# Patient Record
Sex: Female | Born: 1935
Health system: Southern US, Community
[De-identification: ages and names within clinical notes are randomized; demographics above are authoritative.]

## PROBLEM LIST (undated history)

## (undated) DIAGNOSIS — H409 Unspecified glaucoma: Secondary | ICD-10-CM

## (undated) DIAGNOSIS — C439 Malignant melanoma of skin, unspecified: Secondary | ICD-10-CM

## (undated) DIAGNOSIS — G4700 Insomnia, unspecified: Secondary | ICD-10-CM

## (undated) DIAGNOSIS — K5730 Diverticulosis of large intestine without perforation or abscess without bleeding: Secondary | ICD-10-CM

## (undated) DIAGNOSIS — K2970 Gastritis, unspecified, without bleeding: Secondary | ICD-10-CM

## (undated) DIAGNOSIS — G629 Polyneuropathy, unspecified: Secondary | ICD-10-CM

## (undated) DIAGNOSIS — M1990 Unspecified osteoarthritis, unspecified site: Secondary | ICD-10-CM

## (undated) DIAGNOSIS — I10 Essential (primary) hypertension: Secondary | ICD-10-CM

## (undated) DIAGNOSIS — C801 Malignant (primary) neoplasm, unspecified: Secondary | ICD-10-CM

## (undated) DIAGNOSIS — R32 Unspecified urinary incontinence: Secondary | ICD-10-CM

## (undated) DIAGNOSIS — K219 Gastro-esophageal reflux disease without esophagitis: Secondary | ICD-10-CM

## (undated) DIAGNOSIS — Z973 Presence of spectacles and contact lenses: Secondary | ICD-10-CM

## (undated) DIAGNOSIS — G2581 Restless legs syndrome: Secondary | ICD-10-CM

## (undated) HISTORY — DX: Diverticulosis of large intestine without perforation or abscess without bleeding: K57.30

## (undated) HISTORY — DX: Malignant melanoma of skin, unspecified: C43.9

## (undated) HISTORY — DX: Gastritis, unspecified, without bleeding: K29.70

## (undated) HISTORY — DX: Essential (primary) hypertension: I10

## (undated) HISTORY — PX: ABDOMINAL HYSTERECTOMY: SHX81

## (undated) HISTORY — DX: Malignant (primary) neoplasm, unspecified: C80.1

## (undated) HISTORY — PX: COLONOSCOPY: SHX174

## (undated) HISTORY — PX: ESOPHAGOGASTRODUODENOSCOPY: SHX1529

## (undated) HISTORY — DX: Restless legs syndrome: G25.81

---

## 1994-03-16 DIAGNOSIS — C439 Malignant melanoma of skin, unspecified: Secondary | ICD-10-CM

## 1994-03-16 HISTORY — DX: Malignant melanoma of skin, unspecified: C43.9

## 1997-05-02 ENCOUNTER — Ambulatory Visit (HOSPITAL_COMMUNITY): Admission: RE | Admit: 1997-05-02 | Discharge: 1997-05-02 | Payer: Self-pay | Admitting: Obstetrics & Gynecology

## 1999-01-27 ENCOUNTER — Ambulatory Visit (HOSPITAL_COMMUNITY): Admission: RE | Admit: 1999-01-27 | Discharge: 1999-01-27 | Payer: Self-pay | Admitting: Obstetrics & Gynecology

## 1999-01-27 ENCOUNTER — Encounter: Payer: Self-pay | Admitting: Obstetrics & Gynecology

## 1999-06-02 ENCOUNTER — Other Ambulatory Visit: Admission: RE | Admit: 1999-06-02 | Discharge: 1999-06-02 | Payer: Self-pay | Admitting: Obstetrics and Gynecology

## 2000-02-25 ENCOUNTER — Encounter: Payer: Self-pay | Admitting: Obstetrics and Gynecology

## 2000-02-25 ENCOUNTER — Ambulatory Visit (HOSPITAL_COMMUNITY): Admission: RE | Admit: 2000-02-25 | Discharge: 2000-02-25 | Payer: Self-pay | Admitting: Obstetrics and Gynecology

## 2000-08-20 ENCOUNTER — Encounter: Admission: RE | Admit: 2000-08-20 | Discharge: 2000-08-20 | Payer: Self-pay

## 2000-08-27 ENCOUNTER — Encounter: Admission: RE | Admit: 2000-08-27 | Discharge: 2000-08-27 | Payer: Self-pay

## 2001-03-16 DIAGNOSIS — K5730 Diverticulosis of large intestine without perforation or abscess without bleeding: Secondary | ICD-10-CM

## 2001-03-16 HISTORY — DX: Diverticulosis of large intestine without perforation or abscess without bleeding: K57.30

## 2001-04-13 ENCOUNTER — Encounter: Payer: Self-pay | Admitting: Obstetrics and Gynecology

## 2001-04-13 ENCOUNTER — Ambulatory Visit (HOSPITAL_COMMUNITY): Admission: RE | Admit: 2001-04-13 | Discharge: 2001-04-13 | Payer: Self-pay | Admitting: Obstetrics and Gynecology

## 2001-10-10 ENCOUNTER — Ambulatory Visit (HOSPITAL_COMMUNITY): Admission: RE | Admit: 2001-10-10 | Discharge: 2001-10-10 | Payer: Self-pay | Admitting: Gastroenterology

## 2002-04-03 ENCOUNTER — Encounter: Payer: Self-pay | Admitting: *Deleted

## 2002-04-03 ENCOUNTER — Ambulatory Visit (HOSPITAL_COMMUNITY): Admission: RE | Admit: 2002-04-03 | Discharge: 2002-04-03 | Payer: Self-pay | Admitting: *Deleted

## 2003-02-07 ENCOUNTER — Encounter: Admission: RE | Admit: 2003-02-07 | Discharge: 2003-02-07 | Payer: Self-pay

## 2003-08-01 ENCOUNTER — Ambulatory Visit (HOSPITAL_COMMUNITY): Admission: RE | Admit: 2003-08-01 | Discharge: 2003-08-01 | Payer: Self-pay

## 2003-12-24 ENCOUNTER — Encounter: Admission: RE | Admit: 2003-12-24 | Discharge: 2003-12-24 | Payer: Self-pay | Admitting: Family Medicine

## 2004-09-18 ENCOUNTER — Ambulatory Visit (HOSPITAL_COMMUNITY): Admission: RE | Admit: 2004-09-18 | Discharge: 2004-09-18 | Payer: Self-pay | Admitting: *Deleted

## 2005-10-12 ENCOUNTER — Ambulatory Visit (HOSPITAL_COMMUNITY): Admission: RE | Admit: 2005-10-12 | Discharge: 2005-10-12 | Payer: Self-pay | Admitting: Family Medicine

## 2005-10-20 ENCOUNTER — Encounter: Admission: RE | Admit: 2005-10-20 | Discharge: 2005-10-20 | Payer: Self-pay | Admitting: Family Medicine

## 2006-03-16 DIAGNOSIS — K2970 Gastritis, unspecified, without bleeding: Secondary | ICD-10-CM

## 2006-03-16 HISTORY — DX: Gastritis, unspecified, without bleeding: K29.70

## 2006-07-25 ENCOUNTER — Emergency Department (HOSPITAL_COMMUNITY): Admission: EM | Admit: 2006-07-25 | Discharge: 2006-07-25 | Payer: Self-pay | Admitting: Emergency Medicine

## 2006-11-22 ENCOUNTER — Encounter: Admission: RE | Admit: 2006-11-22 | Discharge: 2006-11-22 | Payer: Self-pay | Admitting: Family Medicine

## 2007-05-09 ENCOUNTER — Encounter: Admission: RE | Admit: 2007-05-09 | Discharge: 2007-05-09 | Payer: Self-pay | Admitting: Family Medicine

## 2007-05-12 ENCOUNTER — Encounter: Admission: RE | Admit: 2007-05-12 | Discharge: 2007-05-12 | Payer: Self-pay | Admitting: Family Medicine

## 2007-11-22 ENCOUNTER — Ambulatory Visit (HOSPITAL_COMMUNITY): Admission: RE | Admit: 2007-11-22 | Discharge: 2007-11-22 | Payer: Self-pay | Admitting: Family Medicine

## 2009-06-04 ENCOUNTER — Ambulatory Visit (HOSPITAL_COMMUNITY): Admission: RE | Admit: 2009-06-04 | Discharge: 2009-06-04 | Payer: Self-pay | Admitting: Family Medicine

## 2010-06-25 ENCOUNTER — Other Ambulatory Visit (HOSPITAL_COMMUNITY): Payer: Self-pay | Admitting: Physician Assistant

## 2010-06-25 DIAGNOSIS — Z1231 Encounter for screening mammogram for malignant neoplasm of breast: Secondary | ICD-10-CM

## 2010-07-14 ENCOUNTER — Ambulatory Visit (HOSPITAL_COMMUNITY)
Admission: RE | Admit: 2010-07-14 | Discharge: 2010-07-14 | Disposition: A | Discharge status: Home / Self Care | Payer: Medicare Other | Source: Ambulatory Visit | Attending: Physician Assistant | Admitting: Physician Assistant

## 2010-07-14 DIAGNOSIS — Z1231 Encounter for screening mammogram for malignant neoplasm of breast: Secondary | ICD-10-CM | POA: Insufficient documentation

## 2010-08-01 NOTE — Op Note (Signed)
   Crystal Vazquez, Crystal Vazquez                      ACCOUNT NO.:  [id number]   MEDICAL RECORD NO.:  [id number]                   PATIENT TYPE:  AMB   LOCATION:  ENDO                                 FACILITY:  [facility]   PHYSICIAN:  James L. Malon Kindle., M.D.          DATE OF BIRTH:  [birth year]   DATE OF PROCEDURE:  10/10/2001  DATE OF DISCHARGE:                                 OPERATIVE REPORT   PROCEDURE:  Colonoscopy.   MEDICATIONS:  Fentanyl 87.5 mcg, Versed 8 mg IV.   INDICATIONS FOR PROCEDURE:  Lower abdominal pain and rectal bleeding.   SCOPE:  Olympus pediatric videocolonoscope.   DESCRIPTION OF PROCEDURE:  The procedure had been explained and the  patient's consent obtained.  With the patient in the left lateral decubitus  position the Olympus pediatric videocolonoscope was inserted and advanced  under direct visualization.  The prep was quite good.  The patient had a  somewhat long, tortuous colon with minimal diverticulosis.  Once we had  passed the sigmoid colon area we advanced fairly easily to the cecum;  ileocecal valve and appendiceal orifice seen.  The scope was withdrawn and  the colon carefully examined.  The cecum, ascending colon, hepatic flexure,  transverse colon, splenic flexure, descending, and sigmoid colon seen well  upon removal.  No polyps seen.  A few scattered tics but no evidence of  diverticulitis.  The scope was withdrawn.  The patient tolerated the  procedure well.  Fairly significant enteral hemorrhoids seen in the rectum  upon removal of the scope.  The patient was maintained on low-flow oxygen  with pulse oximeter assessment.   ASSESSMENT:  Rectal bleeding, no evidence of polyps; probably due to  moderate size internal hemorrhoids.   PLAN:  Will treat for hemorrhoids and Metamucil fiber supplements, and a  hemorrhoid instruction sheet.  Will see back in the office in six weeks.                                               James L. Malon Kindle.,  M.D.    Waldron Session  D:  10/10/2001  T:  10/14/2001  Job:  [id number]   cc:   Kizzie Furnish, M.D.

## 2011-01-11 ENCOUNTER — Encounter: Payer: Self-pay | Admitting: Ophthalmology

## 2011-01-11 ENCOUNTER — Emergency Department (HOSPITAL_COMMUNITY): Payer: Medicare Other

## 2011-01-11 ENCOUNTER — Inpatient Hospital Stay (HOSPITAL_COMMUNITY)
Admission: EM | Admit: 2011-01-11 | Discharge: 2011-01-15 | DRG: 392 | Disposition: A | Discharge status: Home / Self Care | Payer: Medicare Other | Attending: Internal Medicine | Admitting: Internal Medicine

## 2011-01-11 DIAGNOSIS — Z79899 Other long term (current) drug therapy: Secondary | ICD-10-CM

## 2011-01-11 DIAGNOSIS — Z882 Allergy status to sulfonamides status: Secondary | ICD-10-CM

## 2011-01-11 DIAGNOSIS — A09 Infectious gastroenteritis and colitis, unspecified: Principal | ICD-10-CM | POA: Diagnosis present

## 2011-01-11 DIAGNOSIS — K644 Residual hemorrhoidal skin tags: Secondary | ICD-10-CM | POA: Diagnosis present

## 2011-01-11 DIAGNOSIS — Z7982 Long term (current) use of aspirin: Secondary | ICD-10-CM

## 2011-01-11 DIAGNOSIS — D126 Benign neoplasm of colon, unspecified: Secondary | ICD-10-CM | POA: Diagnosis present

## 2011-01-11 DIAGNOSIS — K5730 Diverticulosis of large intestine without perforation or abscess without bleeding: Secondary | ICD-10-CM | POA: Diagnosis present

## 2011-01-11 DIAGNOSIS — E7800 Pure hypercholesterolemia, unspecified: Secondary | ICD-10-CM | POA: Diagnosis present

## 2011-01-11 DIAGNOSIS — Z880 Allergy status to penicillin: Secondary | ICD-10-CM

## 2011-01-11 DIAGNOSIS — I10 Essential (primary) hypertension: Secondary | ICD-10-CM | POA: Diagnosis present

## 2011-01-11 LAB — TYPE AND SCREEN
ABO/RH(D): O POS
Antibody Screen: NEGATIVE

## 2011-01-11 LAB — BASIC METABOLIC PANEL
BUN: 16 mg/dL (ref 6–23)
CO2: 26 mEq/L (ref 19–32)
GFR calc non Af Amer: 54 mL/min — ABNORMAL LOW (ref >90)
Glucose, Bld: 109 mg/dL — ABNORMAL HIGH (ref 70–99)
Potassium: 3.9 mEq/L (ref 3.5–5.1)

## 2011-01-11 LAB — DIFFERENTIAL
Eosinophils Relative: 0 % (ref 0–5)
Lymphocytes Relative: 8 % — ABNORMAL LOW (ref 12–46)
Lymphs Abs: 1 10*3/uL (ref 0.7–4.0)
Monocytes Absolute: 1.1 10*3/uL — ABNORMAL HIGH (ref 0.1–1.0)
Neutro Abs: 10.7 10*3/uL — ABNORMAL HIGH (ref 1.7–7.7)

## 2011-01-11 LAB — CBC
HCT: 40.8 % (ref 36.0–46.0)
HCT: 41.2 % (ref 36.0–46.0)
Hemoglobin: 14.4 g/dL (ref 12.0–15.0)
Hemoglobin: 14.5 g/dL (ref 12.0–15.0)
MCHC: 35.3 g/dL (ref 30.0–36.0)
MCV: 93.2 fL (ref 78.0–100.0)
MCV: 93.8 fL (ref 78.0–100.0)
Platelets: 202 10*3/uL (ref 150–400)
RBC: 4.39 MIL/uL (ref 3.87–5.11)
RDW: 12.2 % (ref 11.5–15.5)
RDW: 12.4 % (ref 11.5–15.5)
WBC: 12.9 10*3/uL — ABNORMAL HIGH (ref 4.0–10.5)
WBC: 13.6 10*3/uL — ABNORMAL HIGH (ref 4.0–10.5)
WBC: 13.9 10*3/uL — ABNORMAL HIGH (ref 4.0–10.5)

## 2011-01-11 LAB — PROTIME-INR: INR: 0.95 (ref 0.00–1.49)

## 2011-01-11 LAB — ABO/RH: ABO/RH(D): O POS

## 2011-01-11 NOTE — H&P (Signed)
Hospital Admission Note Date: 01/11/2011  Patient name:  Crystal Vazquez   Medical record number:  [id number] Date of birth:  08/10/1926   Age: 77 y.o. Gender:  female PCP:    No primary provider on file.  Medical Service:   Internal Medicine Teaching Service   Attending physician:  Dr. Cliffton Asters First Contact:   Dr. Margorie John  Pager: [phone] Second Contact:   Dr. Johnette Abraham Pager: [phone] After Hours:    First Contact    Pager: [phone]      Second Contact   Pager: [phone]   Chief Complaint: abdominal cramping and bloody diarrhea  History of Present Illness: Patient is a 77 y.o. female with a PMHx of diverticulosis and gastritis, who presents with abdominal cramping and multiple episodes of bloody diarrhea that began last night after dinner. The pt was in her usual state of health until yesterday evening. She went out for dinner with her husband, son, and daughter-in-law; she ate a salad and crab cakes. On the way home from dinner around 8pm, pt began experiencing abdominal cramping, rated at 10/10, and began sweating. She stopped at a convenience store on the way home to use the bathroom, and had a bowel movement, which she described as brown and loose with bright red blood mixed in. She normally has harder stools, and generally has to strain to defecate. Pt reports waking up several times overnight to have more loose bloody stools. At one point, she stood up to use the bathroom and became lightheaded and consequently fell. She reports no injuries from this fall. When she woke up in the morning, she noticed that her nightgown and robe were stained with blood that had leaked from her rectum overnight. The abdominal pain has since improved; the cramping is intermittent with ongoing 2/10 pain. Pt reports no nausea or vomiting, no fevers, no chest pain or shortness of breath. Of note, her husband ate half of a crab cake at dinner and experienced abdominal cramping and nonbloody  diarrhea. No one else at dinner experienced these symptoms. She had an endoscopy in 2008 for gastritis and a colonoscopy in 2003.  Current Outpatient Medications: Current Outpatient Prescriptions  Medication Sig Dispense Refill  . aspirin 81 MG tablet Take 81 mg by mouth daily.        Marland Kitchen gabapentin (NEURONTIN) 300 MG capsule Take 300 mg by mouth 2 (two) times daily.        Marland Kitchen lisinopril (PRINIVIL,ZESTRIL) 20 MG tablet Take 20 mg by mouth daily.        Marland Kitchen zolpidem (AMBIEN) 10 MG tablet Take 10 mg by mouth at bedtime.         Allergies: Allergies  Allergen Reactions  . Penicillins Hives  . Sulfa Drugs Cross Reactors Hives    Past Medical History: Past Medical History  Diagnosis Date  . Diverticulosis of colon (without mention of hemorrhage) 2003    colonoscopy 2003  . Restless legs syndrome (RLS)   . Hypertension   . Gastritis 2008    endoscopy 2008  . Melanoma 1996    on back, excised with no recurrence   Past Surgical History: Past Surgical History  Procedure Date  . Abdominal hysterectomy    Family History: Family History  Problem Relation Age of Onset  . Alzheimer's disease Sister   . Breast cancer      sisters, aunt  . Aneurysm Mother     AAA  . Hypertension      multiple family  members  . Diabetes type II Brother   . Heart disease Mother     mother, aunts, uncles had"heart problems" unknown to pt   Social History: History   Social History  . Marital Status: Married    Spouse Name: N/A    Number of Children: N/A  . Years of Education: N/A   Occupational History  . Not on file.   Social History Main Topics  . Smoking status: Never Smoker   . Smokeless tobacco: Never Used  . Alcohol Use: No  . Drug Use: No  . Sexually Active: Not on file   Social History Narrative   Lives with husband. Has been retired for 16 years. Previously worked in Education officer, community and in a Museum/gallery curator. Has one son who lives near [place] with his wife and son.   Review  of Systems: Constitutional:  Denies fever, chills, diaphoresis, appetite change and fatigue.  Respiratory: Denies SOB, DOE, cough, chest tightness, and wheezing.  Cardiovascular: Denies chest pain, palpitations and leg swelling.  Gastrointestinal: Reports abdominal pain, diarrhea, blood in stool. Usually experiences constipation. Denies nausea, vomiting, and abdominal distention.  Genitourinary: Denies dysuria, urgency, frequency, hematuria, flank pain and difficulty urinating.  Skin: Denies pallor, rash and wound.  Neurological: Reports lightheadedness on standing. Denies dizziness, seizures, syncope, weakness, numbness and headaches.   Hematological: Denies adenopathy, easy bruising, personal or family bleeding history.  Psychiatric/ Behavioral: Denies suicidal ideation, mood changes, confusion, nervousness, sleep disturbance and agitation.   Vital Signs: T: 98.1 P: 80 BP: 179/80 RR: 22 O2 sat: 99% RA   Physical Exam: General: Vital signs reviewed and noted. Well-developed, well-nourished, in no acute distress; alert, appropriate and cooperative throughout examination.  Head: Normocephalic, atraumatic.  Eyes: PERRL, EOMI, No signs of anemia or jaundince.  Nose: Mucous membranes moist, not inflammed, nonerythematous.  Throat: Oropharynx nonerythematous, no exudate appreciated.   Neck: No deformities, masses, or tenderness noted.Supple, No carotid Bruits, no JVD.  Lungs:  Normal respiratory effort. Clear to auscultation BL without crackles or wheezes.  Heart: RRR. S1 and S2 normal without gallop, murmur, or rubs.  Abdomen:  BS hypoactive. Soft, Nondistended, diffusely tender to palpation.  Involuntary guarding and moderately tender to palpation in LLQ. No masses or organomegaly.  Extremities: 1+ pretibial edema.  Neurologic: A&O X3, CN II - XII are grossly intact. Motor strength is 5/5 in the all 4 extremities, Sensations intact to light touch, Cerebellar signs negative.  Skin: No visible  rashes. Slight but visible scar on right lower back from melanoma removal, midline incision scar from abdominal hysterectomy. Multiple seborrheic keratoses on back and abdomen, multiple cherry hemangiomas on abdomen.   Lab results: Basic Metabolic Panel: Recent Labs  [facility] 01/11/11 0951   NA 137   K 3.9   CL 101   CO2 26   GLUCOSE 109*   BUN 16   CREATININE 0.96   CALCIUM 10.0   MG --   PHOS --   CBC: Recent Labs  [facility] 01/11/11 0951   WBC 12.9*   NEUTROABS 10.7*   HGB 14.4   HCT 40.8   MCV 93.2   PLT 218   Imaging results:  ABDOMEN - 2 VIEW. IMPRESSION: No evidence of small bowel obstruction or free air.  Assessment & Plan: This is a pleasant 77 year old female with a history of diverticulosis and gastritis who presents with bright red blood per rectum and loose stools beginning after dinner last night. Because she has likely lost a good amount of  blood and fluid, we will admit her to the floor for fluid resuscitation, observation, and diagnosis and treatment of GI bleed.  1) Bloody diarrhea- Etiology is unclear at this point. The fact that this has presented with urgency and cramping, presents with a high WBC count, and shares similar characteristics to the symptoms experienced by her husband, who ate the same food, support an infectious cause. Lack of fever, chills, nausea, and vomiting make infection less likely, but do not rule it out. Possible infectious causes include EHEC, EIEC, Salmonella, Shigella, Campylobacter, and C. difficile. Her finding of diverticulosis on her last colonoscopy supports a diverticular bleed, but the diarrhea that accompanies it does not seem to support this diagnosis. Other differential diagnoses include ischemic colitis, colon cancer, and polyp. Because the pt has had bright red blood per rectum, rather than melena, a lower GI bleed is much more likely than an upper GI bleed, so bleeding ulcers and recurrent gastritis fall lower on the  differential.  -Abdominal CT -C. diff PCR, contact precautions -Stool culture for Salmonella, Shigella, Campylobacter -Protonix  2) Abdominal cramping- Etiology is unclear at this point, but suggests an infectious cause as mentioned above. -prn morphine  3) Lightheadedness and fall- The lightheadedness is likely due to the loss of blood and fluid. Pt's Hgb and fluid status will need to be monitored to ensure proper rehydration and to prevent anemia. -CBCs q6hr -NS @ 75cc/hr -Type and screen 2 units -Place two large bore IVs -Monitor Hgb and transfuse if <7 or symptomatic -Fall precautions  4) DVT PPX- SCDs    Butler Denmark, MSIII: _______________________________________  Date/ Time: _______________________________________    Margorie John, M.D. (PGY1): ____________________________________    Date/ Time: ____________________________________     Johnette Abraham, D.O. (PGY2): ____________________________________    Date/ Time: ____________________________________      I have seen and examined the patient. I reviewed the resident/fellow note and agree with the findings and plan of care as documented. My additions and revisions are included.   Signature:  ____________________________________________     Internal Medicine Teaching Service Attending    Date:    ____________________________________________

## 2011-01-12 LAB — CBC
HCT: 35 % — ABNORMAL LOW (ref 36.0–46.0)
Hemoglobin: 12.3 g/dL (ref 12.0–15.0)
MCH: 32.7 pg (ref 26.0–34.0)
MCH: 32.7 pg (ref 26.0–34.0)
MCHC: 34.5 g/dL (ref 30.0–36.0)
MCHC: 34.3 g/dL (ref 30.0–36.0)
MCHC: 34.4 g/dL (ref 30.0–36.0)
MCV: 94.8 fL (ref 78.0–100.0)
MCV: 94.6 fL (ref 78.0–100.0)
Platelets: 192 10*3/uL (ref 150–400)
Platelets: 196 10*3/uL (ref 150–400)
Platelets: 216 10*3/uL (ref 150–400)
RBC: 3.78 MIL/uL — ABNORMAL LOW (ref 3.87–5.11)
RBC: 4.04 MIL/uL (ref 3.87–5.11)
RDW: 12.5 % (ref 11.5–15.5)

## 2011-01-12 LAB — COMPREHENSIVE METABOLIC PANEL
ALT: 10 U/L (ref 0–35)
AST: 13 U/L (ref 0–37)
Albumin: 2.8 g/dL — ABNORMAL LOW (ref 3.5–5.2)
Chloride: 109 mEq/L (ref 96–112)
Creatinine, Ser: 0.95 mg/dL (ref 0.50–1.10)
Potassium: 3.6 mEq/L (ref 3.5–5.1)
Sodium: 141 mEq/L (ref 135–145)
Total Bilirubin: 0.8 mg/dL (ref 0.3–1.2)

## 2011-01-12 NOTE — Consult Note (Signed)
  NAMEOPHA, MCGHEE NO.:  [id number]  MEDICAL RECORD NO.:  [id number]  LOCATION:  6739                         FACILITY:  [facility]  PHYSICIAN:  Graylin Shiver, M.D.   DATE OF BIRTH:  October 19, 1926  DATE OF CONSULTATION:  01/11/2011 DATE OF DISCHARGE:                                CONSULTATION   REASON FOR CONSULTATION:  The patient is an 77 year old female, who went out to eat last night, and ate one and a half crab cakes along with a salad.  Shortly after eating that dinner on the way home, she had to stop because of abdominal cramping and use a bathroom.  She had diarrhea, then when she got home, she had more diarrhea and there was rectal bleeding associated with it.  She has continued to have some abdominal cramping as well as diarrhea with rectal bleeding and came to the emergency room today where she was subsequently admitted.  Of note is that her husband ate half of her crab cake and he also noticed some abdominal cramping and one episode of diarrhea, but his problem resolved.  She was seen in the ER and subsequently admitted for further evaluation.  She had a colonoscopy in 2003 which showed diverticulosis and some hemorrhoids.  There were no polyps.  ALLERGIES:  SULFA, PENICILLIN.  MEDICAL PROBLEMS: 1. Hypertension. 2. Hypercholesterolemia. 3. Prior surgeries. 4. Hysterectomy.  SOCIAL HISTORY:  Does not smoke, drink alcohol.  REVIEW OF SYSTEMS:  Systems review, no complaints of chest pain or shortness of breath.  PHYSICAL EXAMINATION:  GENERAL:  She does not appear in any acute distress.  She is nonicteric. HEART:  Regular rhythm.  No murmurs. LUNGS:  Clear. ABDOMEN:  Bowel sounds are present.  It is soft, nontender.  No hepatosplenomegaly.  IMPRESSION:  Diarrhea with rectal bleeding.  This may be an infectious diarrhea.  It is possible rectal bleeding could come from diverticulosis or from hemorrhoids.  Her hemoglobin and hematocrit are 14.4  and 40.8 respectively.  She does not appear in any specific distress at this time.  PLAN:  Check stool for enteric pathogens and also Clostridium difficile, although she does not recall being on any antibiotics lately.  Follow clinically.  Follow H and Hs.          ______________________________ Graylin Shiver, M.D.     SFG/MEDQ  D:  01/11/2011  T:  01/12/2011  Job:  [id number]  cc:   Cliffton Asters, M.D. James L. Malon Kindle., M.D.  Electronically Signed by Herbert Moors MD on 01/12/2011 09:18:14 AM

## 2011-01-13 DIAGNOSIS — R109 Unspecified abdominal pain: Secondary | ICD-10-CM

## 2011-01-13 DIAGNOSIS — K921 Melena: Secondary | ICD-10-CM

## 2011-01-13 LAB — CBC
HCT: 34.5 % — ABNORMAL LOW (ref 36.0–46.0)
Hemoglobin: 11.6 g/dL — ABNORMAL LOW (ref 12.0–15.0)
MCV: 96.4 fL (ref 78.0–100.0)
Platelets: 190 10*3/uL (ref 150–400)
Platelets: 196 10*3/uL (ref 150–400)
RBC: 3.52 MIL/uL — ABNORMAL LOW (ref 3.87–5.11)
RBC: 3.58 MIL/uL — ABNORMAL LOW (ref 3.87–5.11)
RDW: 12.8 % (ref 11.5–15.5)
WBC: 10.7 10*3/uL — ABNORMAL HIGH (ref 4.0–10.5)
WBC: 10 10*3/uL (ref 4.0–10.5)
WBC: 10.1 10*3/uL (ref 4.0–10.5)

## 2011-01-14 DIAGNOSIS — K921 Melena: Secondary | ICD-10-CM

## 2011-01-14 DIAGNOSIS — R109 Unspecified abdominal pain: Secondary | ICD-10-CM

## 2011-01-14 LAB — CBC
HCT: 31.1 % — ABNORMAL LOW (ref 36.0–46.0)
Hemoglobin: 10.5 g/dL — ABNORMAL LOW (ref 12.0–15.0)
MCH: 32.5 pg (ref 26.0–34.0)
MCH: 32.4 pg (ref 26.0–34.0)
MCHC: 33.8 g/dL (ref 30.0–36.0)
MCV: 96.7 fL (ref 78.0–100.0)
Platelets: 231 10*3/uL (ref 150–400)
RBC: 3.61 MIL/uL — ABNORMAL LOW (ref 3.87–5.11)

## 2011-01-15 ENCOUNTER — Other Ambulatory Visit: Payer: Self-pay | Admitting: Gastroenterology

## 2011-01-15 DIAGNOSIS — R109 Unspecified abdominal pain: Secondary | ICD-10-CM

## 2011-01-15 DIAGNOSIS — K921 Melena: Secondary | ICD-10-CM

## 2011-01-15 LAB — CBC
HCT: 31.5 % — ABNORMAL LOW (ref 36.0–46.0)
MCH: 32.4 pg (ref 26.0–34.0)
MCHC: 34 g/dL (ref 30.0–36.0)
RDW: 12.5 % (ref 11.5–15.5)

## 2011-01-16 ENCOUNTER — Emergency Department (HOSPITAL_COMMUNITY)
Admission: EM | Admit: 2011-01-16 | Discharge: 2011-01-17 | Disposition: A | Discharge status: Home / Self Care | Payer: Medicare Other | Attending: Emergency Medicine | Admitting: Emergency Medicine

## 2011-01-16 DIAGNOSIS — I10 Essential (primary) hypertension: Secondary | ICD-10-CM | POA: Insufficient documentation

## 2011-01-16 DIAGNOSIS — E7800 Pure hypercholesterolemia, unspecified: Secondary | ICD-10-CM | POA: Insufficient documentation

## 2011-01-16 DIAGNOSIS — Z79899 Other long term (current) drug therapy: Secondary | ICD-10-CM | POA: Insufficient documentation

## 2011-01-19 NOTE — Discharge Summary (Signed)
NAMEARMENIA, Crystal Vazquez NO.:  [id number]  MEDICAL RECORD NO.:  [id number]  LOCATION:  6739                         FACILITY:  [facility]  PHYSICIAN:  Lacretia Leigh. Hatcher, M.D.DATE OF BIRTH:  1926/06/08  DATE OF ADMISSION:  01/11/2011 DATE OF DISCHARGE:  01/15/2011                              DISCHARGE SUMMARY   DISCHARGE DIAGNOSES: 1. Gastrointestinal bleed.  The patient presented with bloody     diarrhea.  The cause of which was not completely ascertained.     Bleeding resolved by discharge. 2. Abdominal cramping and diarrhea, resolved on hospital day 2. 3. Acute blood loss anemia.  Hemoglobin dropped from 14.4 to 10.7 over     the course of hospitalization.  The patient experienced no symptoms     of anemia and was not transfused.  DISCHARGE MEDICATIONS: 1. Aspirin 81 mg tablet p.o. daily. 2. Gabapentin 300 mg capsule p.o. b.i.d. 3. Xalatan 1 drop in left eye every evening. 4. Lisinopril 5 mg tablet p.o. every morning. 5. Zolpidem 10 mg tablet p.o. every evening.  DISPOSITION AND FOLLOWUP:  The patient will follow up with her PCP, Dr. Pat Kocher on January 22, 2011, at 2:30 p.m.  Please recheck hemoglobin at that time and inquire about symptoms of anemia and resolution of GI bleeding and loose stools.  PROCEDURES PERFORMED: 1. Abdominal x-ray two view, January 11, 2011.  Impression:  No     evidence of small bowel obstruction or free air. 2. Colonoscopy January 15, 2011.  Findings:  External hemorrhoids,     otherwise normal digital rectal exam.  Quality was good.  Mild     colitis manifested by superficial ulceration, tissue     edema/friability, loss of vascularity was seen in confluent fashion     from distal transverse colon to the level of the proximal sigmoid     colon.  Biopsies obtained.  Few sigmoid colon diverticula.  4-mm     cecal polyp, removed with cold snare.  Otherwise normal colonoscopy     without apparent polyp, masses, vascular  ectasias, or inflammatory     changes.  No pseudomembranes.  No old or fresh blood seen to the     extent of our examination.  ENDOSCOPIC IMPRESSION: 1. Resolving colitis highly likely source of patient's diarrhea and     hematochezia, suspect infectious colitis.  Ischemic colitis is     another consideration.  Appearance is not typical of inflammatory     colitis due to Crohn's disease.  Biopsies obtained. 2. Sigmoid diverticulosis. 3. Diminutive cecal polyp, removed. 4. External hemorrhoids.  BIOPSY RESULTS: 1. Cecum polyp, tubular adenoma, negative for high-grade dysplasia or     malignancy. 2. Biopsy of transverse/descending colon, reactive for degenerative     colon mucosa with preserved crypt architecture and patchy minimal     neutrophilic inflammation.  See comments.  Negative for dysplasia.     The bowel findings are not thought to have idiopathic inflammatory     bowel disease or microscopic colitis.  The primary morphologic     consideration include infectious colitis or early ischemic-type     injury.  CONSULTATIONS:  GI, Dr. Dulce Sellar.  BRIEF ADMITTING  H AND P:  The patient is an 77 year old female with a past medical history of diverticulosis and gastritis who presents with abdominal cramping and multiple episodes of bloody diarrhea that began the evening before admission after dinner.  The patient was in her usual state of health until the evening before admission.  She went out for dinner with her husband, son, and daughter-in-law.  She had a salad and crab cakes.  On the way home from dinner around 8 p.m., the patient began experiencing abdominal cramping rated at 10/10 and began sweating. She stopped at a convenience store on the way home to use the bathroom and had a bowel movement, which she described as brown and loose with bright red blood.  She normally has harder stools and generally has to strain to defecate.  The patient reports waking up several  times overnight to have more loose bloody stools.  At one point, she stood up to use the bathroom and became lightheaded and consequently fell.  She reports no injuries from the fall.  When she woke up in the morning, she noticed that her neck were stained with blood that are from her rectum overnight.  The abdominal pain has since improved.  The cramping is intermittent with ongoing 2/10 pain.  The patient reports no nausea or vomiting.  No fevers.  No chest pain or shortness of breath.  Of note, her husband gave half of the crab cake at dinner and experienced abdominal cramping and nonbloody diarrhea.  No one else at dinner experienced these symptoms.  She had an endoscopy in 2008 for gastritis and a colonoscopy in 2003.  ADMITTING PHYSICAL EXAMINATION:  VITAL SIGNS:  Temperature 98.1, heart rate 80, blood pressure 179/80, respiratory rate 22, O2 saturation 99% on room air. GENERAL:  Vital signs reviewed and noted.  Well developed, well nourished, in no acute distress, alert, appropriate, and cooperative throughout examination. HEAD:  Normocephalic, atraumatic. EYES:  Pupils equally round and reactive to light.  Extraocular movements are intact.  No signs of anemia or jaundice. NOSE:  Mucous membranes moist.  Noninflamed, nonerythematous. THROAT:  Oropharynx nonerythematous, no exudate appreciated. NECK:  No deformities, masses, or tenderness noted.  Supple.  No carotid bruits.  No JVD. LUNGS:  Normal respiratory effort.  Clear to auscultation bilaterally without crackles or wheezes. HEART:  Regular rate and rhythm.  S1 and S2 normal without gallop, murmur, or rub. ABDOMEN:  Bowel sounds hypoactive, soft, nondistended, diffusely tender to palpation.  Involuntary guarding and moderately tender to palpation in the left lower quadrant.  No masses or organomegaly. EXTREMITIES:  1+ pretibial edema. NEUROLOGIC:  Alert and oriented x3.  Cranial nerves II through XII are grossly intact.   Motor strength is 5/5 in all 4 extremities.  Sensation is intact to light touch.  Cerebellar sign is negative. SKIN:  No visible rashes.  Site of scar in right lower back from melanoma removal.  Midline incision scar from abdominal hysterectomy. Multiple seborrheic keratoses on back and abdomen.  Multiple cherry hemangiomas on abdomen.  ADMITTING LABS:  Basic metabolic panel; sodium 137, potassium 3.9, chloride 101, bicarb 26, BUN 16, creatinine 0.96, glucose 109, calcium 10.  CBC; white blood cell count 12.9, hemoglobin 14.4, hematocrit 40.8, MCV 93.2, platelets 218.  HOSPITAL COURSE BY PROBLEM: 1. GI bleed.  The patient presented with bloody diarrhea.  The cause     of which was not completely ascertained.  After the patient's loose     stools stopped, she had  2 episodes of bright red blood per rectum     with little to no stool.  We performed a colonoscopy, but we were     unable to determine the source of the bleed.  Colitis was noted on     colonoscopy, which may have been caused by infectious or ischemic     source and may be the cause of bleeding.  GI bleeding resolved by     discharge. 2. Abdominal cramping and diarrhea.  Abdominal cramping was controlled     with p.r.n. morphine, which the patient only needed on hospital     days 1 and 2.  The patient presented with multiple loose stools,     but her last loose stool was in the emergency department on     admission.  Abdominal cramping and diarrhea resolved early in the     patient's hospital course.  The etiology is likely related to     problem #1. 3. Acute blood loss anemia.  Hemoglobin dropped from 14.4 to 10.7 over     the course of hospitalization.  The patient was monitored closely     for signs or symptoms of anemia and because she remained     asymptomatic was not transfused.  The patient will be followed up     outpatient to ensure appropriate response after GI bleed.  DISCHARGE VITAL SIGNS:  Temperature 97.6,  heart rate 63, blood pressure 128/66, respiratory rate 24, O2 saturation 96% on room air.  DISCHARGE LABS:  CBC, white blood cell count 5.7, hemoglobin 10.7, hematocrit 31.5, MCV 95.5, platelets 210.  MRSA/PCR negative.    ______________________________ Vernice Jefferson, MD   ______________________________ Lacretia Leigh. Ninetta Lights, M.D.    NK/MEDQ  D:  01/19/2011  T:  01/19/2011  Job:  [id number]  cc:   Pat Kocher, MD

## 2011-06-17 ENCOUNTER — Ambulatory Visit
Admission: RE | Admit: 2011-06-17 | Discharge: 2011-06-17 | Disposition: A | Discharge status: Home / Self Care | Payer: Medicare Other | Source: Ambulatory Visit | Attending: Physician Assistant | Admitting: Physician Assistant

## 2011-06-17 ENCOUNTER — Other Ambulatory Visit: Payer: Self-pay | Admitting: Physician Assistant

## 2011-06-17 DIAGNOSIS — M549 Dorsalgia, unspecified: Secondary | ICD-10-CM

## 2011-08-03 ENCOUNTER — Other Ambulatory Visit (HOSPITAL_COMMUNITY): Payer: Self-pay | Admitting: Physician Assistant

## 2011-08-03 DIAGNOSIS — Z1231 Encounter for screening mammogram for malignant neoplasm of breast: Secondary | ICD-10-CM

## 2011-08-31 ENCOUNTER — Ambulatory Visit (HOSPITAL_COMMUNITY): Payer: Medicare Other

## 2011-09-10 ENCOUNTER — Ambulatory Visit
Admission: RE | Admit: 2011-09-10 | Discharge: 2011-09-10 | Disposition: A | Discharge status: Home / Self Care | Payer: Medicare Other | Source: Ambulatory Visit | Attending: Physician Assistant | Admitting: Physician Assistant

## 2011-09-10 DIAGNOSIS — Z1231 Encounter for screening mammogram for malignant neoplasm of breast: Secondary | ICD-10-CM

## 2011-12-10 ENCOUNTER — Ambulatory Visit: Payer: Self-pay | Admitting: Radiation Oncology

## 2011-12-15 ENCOUNTER — Ambulatory Visit: Payer: Self-pay | Admitting: Radiation Oncology

## 2011-12-24 LAB — CBC CANCER CENTER
Basophil #: 0.1 x10 3/mm (ref 0.0–0.1)
Eosinophil %: 3.3 %
Lymphocyte #: 1.3 x10 3/mm (ref 1.0–3.6)
Monocyte #: 0.5 x10 3/mm (ref 0.2–0.9)
Monocyte %: 9.5 %
Neutrophil #: 3.4 x10 3/mm (ref 1.4–6.5)
Neutrophil %: 62.2 %
Platelet: 243 x10 3/mm (ref 150–440)
RBC: 4.13 10*6/uL (ref 3.80–5.20)
WBC: 5.5 x10 3/mm (ref 3.6–11.0)

## 2011-12-31 LAB — CBC CANCER CENTER
Basophil #: 0.1 x10 3/mm (ref 0.0–0.1)
Basophil %: 1.1 %
Eosinophil #: 0.2 x10 3/mm (ref 0.0–0.7)
HCT: 40.7 % (ref 35.0–47.0)
MCV: 97 fL (ref 80–100)
Monocyte #: 0.6 x10 3/mm (ref 0.2–0.9)
Monocyte %: 10.1 %
Neutrophil #: 3.9 x10 3/mm (ref 1.4–6.5)
RBC: 4.2 10*6/uL (ref 3.80–5.20)
WBC: 6.1 x10 3/mm (ref 3.6–11.0)

## 2012-01-07 LAB — CBC CANCER CENTER
Basophil %: 0.9 %
Eosinophil #: 0.2 x10 3/mm (ref 0.0–0.7)
Eosinophil %: 2.8 %
HGB: 13.4 g/dL (ref 12.0–16.0)
Lymphocyte #: 1.3 x10 3/mm (ref 1.0–3.6)
MCH: 32.3 pg (ref 26.0–34.0)
MCV: 98 fL (ref 80–100)
Monocyte #: 0.6 x10 3/mm (ref 0.2–0.9)
Monocyte %: 9.5 %
Neutrophil #: 4.5 x10 3/mm (ref 1.4–6.5)
Platelet: 270 x10 3/mm (ref 150–440)
RBC: 4.16 10*6/uL (ref 3.80–5.20)
WBC: 6.8 x10 3/mm (ref 3.6–11.0)

## 2012-01-14 LAB — CBC CANCER CENTER
Eosinophil %: 2.5 %
HCT: 40.8 % (ref 35.0–47.0)
HGB: 13.4 g/dL (ref 12.0–16.0)
Lymphocyte %: 14.3 %
MCHC: 32.9 g/dL (ref 32.0–36.0)
Monocyte #: 0.8 x10 3/mm (ref 0.2–0.9)
Monocyte %: 9 %
Neutrophil %: 73.6 %

## 2012-01-15 ENCOUNTER — Ambulatory Visit: Payer: Self-pay | Admitting: Radiation Oncology

## 2012-01-21 LAB — CBC CANCER CENTER
Basophil %: 1.1 %
Eosinophil %: 4.4 %
HCT: 38.9 % (ref 35.0–47.0)
Lymphocyte %: 20.1 %
MCHC: 32.9 g/dL (ref 32.0–36.0)
MCV: 98 fL (ref 80–100)
Monocyte #: 0.5 x10 3/mm (ref 0.2–0.9)
Monocyte %: 9.6 %
Platelet: 255 x10 3/mm (ref 150–440)
RDW: 12.2 % (ref 11.5–14.5)
WBC: 5.7 x10 3/mm (ref 3.6–11.0)

## 2012-02-14 ENCOUNTER — Ambulatory Visit: Payer: Self-pay | Admitting: Radiation Oncology

## 2012-07-26 ENCOUNTER — Ambulatory Visit: Payer: Self-pay | Admitting: Radiation Oncology

## 2012-08-14 ENCOUNTER — Ambulatory Visit: Payer: Self-pay | Admitting: Radiation Oncology

## 2013-01-24 ENCOUNTER — Ambulatory Visit: Payer: Self-pay | Admitting: Radiation Oncology

## 2013-02-13 ENCOUNTER — Ambulatory Visit: Payer: Self-pay | Admitting: Radiation Oncology

## 2014-01-03 ENCOUNTER — Encounter: Payer: Self-pay | Admitting: Surgery

## 2014-01-24 ENCOUNTER — Ambulatory Visit: Payer: Self-pay | Admitting: Radiation Oncology

## 2014-02-13 ENCOUNTER — Ambulatory Visit: Payer: Self-pay | Admitting: Radiation Oncology

## 2014-05-28 DIAGNOSIS — L988 Other specified disorders of the skin and subcutaneous tissue: Secondary | ICD-10-CM | POA: Insufficient documentation

## 2014-05-28 DIAGNOSIS — Z85828 Personal history of other malignant neoplasm of skin: Secondary | ICD-10-CM | POA: Insufficient documentation

## 2016-01-28 NOTE — H&P (Signed)
  Crystal Vazquez is an 82 y.o. female.    Chief Complaint: right shoulder pain  HPI: Pt is a 82 y.o. female complaining of right shoulder pain for multiple years. Pain had continually increased since the beginning. X-rays in the clinic show end-stage arthritic changes of the right shoulder. Pt has tried various conservative treatments which have failed to alleviate their symptoms, including injections and therapy. Various options are discussed with the patient. Risks, benefits and expectations were discussed with the patient. Patient understand the risks, benefits and expectations and wishes to proceed with surgery.   PCP:  No primary care provider on file.  D/C Plans: Home  PMH: Past Medical History:  Diagnosis Date  . Cancer    SCC Scalp  . Diverticulosis of colon (without mention of hemorrhage) 2003   colonoscopy 2003  . Gastritis 2008   endoscopy 2008  . Hypertension   . Melanoma 1996   on back, excised with no recurrence  . Restless legs syndrome (RLS)     PSH: Past Surgical History:  Procedure Laterality Date  . ABDOMINAL HYSTERECTOMY      Social History:  reports that she has never smoked. She has never used smokeless tobacco. She reports that she does not drink alcohol or use drugs.  Allergies:  Allergies  Allergen Reactions  . Monosodium Glutamate Other (See Comments)    unknown  . Penicillins Hives  . Sulfa Drugs Cross Reactors Hives    Medications: No current facility-administered medications for this encounter.    Current Outpatient Prescriptions  Medication Sig Dispense Refill  . aspirin 81 MG tablet Take 81 mg by mouth daily.      Marland Kitchen atorvastatin (LIPITOR) 40 MG tablet Take 1 tablet by mouth daily.    Marland Kitchen esomeprazole (NEXIUM) 40 MG capsule Take 1 capsule by mouth daily.    Marland Kitchen gabapentin (NEURONTIN) 300 MG capsule Take 300 mg by mouth 2 (two) times daily.      Marland Kitchen latanoprost (XALATAN) 0.005 % ophthalmic solution Place 1 drop into the left eye at bedtime.       Marland Kitchen lisinopril (PRINIVIL,ZESTRIL) 5 MG tablet Take 5 mg by mouth every morning.      . Tafluprost 0.0015 % SOLN Apply 1 drop to eye at bedtime.    . traZODone (DESYREL) 50 MG tablet Take 1 tablet by mouth at bedtime.    Marland Kitchen zolpidem (AMBIEN) 10 MG tablet Take 10 mg by mouth at bedtime.        No results found for this or any previous visit (from the past 48 hour(s)). No results found.  ROS: Pain with rom of the right upper extremity  Physical Exam:  Alert and oriented 82 y.o. female in no acute distress Cranial nerves 2-12 intact Cervical spine: full rom with no tenderness, nv intact distally Chest: active breath sounds bilaterally, no wheeze rhonchi or rales Heart: regular rate and rhythm, no murmur Abd: non tender non distended with active bowel sounds Hip is stable with rom  Right shoulder with limited rom and weakness with ER and IR No rashes or edema  Assessment/Plan Assessment: right shoulder cuff arthropathy  Plan: Patient will undergo a right reverse total shoulder by Dr. Veverly Fells at [facility]. Risks benefits and expectations were discussed with the patient. Patient understand risks, benefits and expectations and wishes to proceed.

## 2016-02-07 ENCOUNTER — Encounter (HOSPITAL_COMMUNITY): Payer: Self-pay

## 2016-02-07 NOTE — Pre-Procedure Instructions (Signed)
Crystal Vazquez  02/07/2016      [facility] - [place], [address] [place] [address] Phone: [phone] Fax: [place], [place] [address] [place] [address] Phone: [phone] Fax: [phone]  [place], [address] [address] Phone: [phone] Fax: [phone]    Your procedure is scheduled on Friday, December 1st, 2017.  Report to [facility] Admitting at 10:40 A.M.   Call this number if you have problems the morning of surgery:  [phone]   Remember:  Do not eat food or drink liquids after midnight.   Take these medicines the morning of surgery with A SIP OF WATER: Acetaminophen (Tylenol) if needed, Gabapentin (Neurontin), Omeprazole (Prilosec).  Stop taking: Aspirin, NSAIDS, Aleve, Naproxen, Ibuprofen, Advil, Motrin, BC's, Goody's, Fish oil, all herbal medications, and all vitamins.    Do not wear jewelry, make-up or nail polish.  Do not wear lotions, powders, or perfumes, or deoderant.  Do not shave 48 hours prior to surgery.   Do not bring valuables to the hospital.  [facility] is not responsible for any belongings or valuables.  Contacts, dentures or bridgework may not be worn into surgery.  Leave your suitcase in the car.  After surgery it may be brought to your room.  For patients admitted to the hospital, discharge time will be determined by your treatment team.  Patients discharged the day of surgery will not be allowed to drive home.   Special instructions:  Preparing for Surgery.   [facility]- Preparing For Surgery  Before surgery, you can play an important role. Because skin is not sterile, your skin needs to be as free of germs as possible. You can reduce the number of germs on your skin by washing with CHG (chlorahexidine gluconate)  Soap before surgery.  CHG is an antiseptic cleaner which kills germs and bonds with the skin to continue killing germs even after washing.  Please do not use if you have an allergy to CHG or antibacterial soaps. If your skin becomes reddened/irritated stop using the CHG.  Do not shave (including legs and underarms) for at least 48 hours prior to first CHG shower. It is OK to shave your face.  Please follow these instructions carefully.   1. Shower the NIGHT BEFORE SURGERY and the MORNING OF SURGERY with CHG.   2. If you chose to wash your hair, wash your hair first as usual with your normal shampoo.  3. After you shampoo, rinse your hair and body thoroughly to remove the shampoo.  4. Use CHG as you would any other liquid soap. You can apply CHG directly to the skin and wash gently with a scrungie or a clean washcloth.   5. Apply the CHG Soap to your body ONLY FROM THE NECK DOWN.  Do not use on open wounds or open sores. Avoid contact with your eyes, ears, mouth and genitals (private parts). Wash genitals (private parts) with your normal soap.  6. Wash thoroughly, paying special attention to the area where your surgery will be performed.  7. Thoroughly rinse your body with warm water from the neck down.  8. DO NOT shower/wash with your normal soap after using and rinsing off the CHG Soap.  9. Pat yourself dry with a CLEAN TOWEL.  10. Wear CLEAN PAJAMAS   11. Place CLEAN SHEETS on your bed the night of your first shower and DO NOT SLEEP WITH PETS.  Day of Surgery: Do not apply any deodorants/lotions. Please wear clean clothes to the hospital/surgery center.     Please read over the following fact sheets that you were given. MRSA Information

## 2016-02-10 ENCOUNTER — Encounter (HOSPITAL_COMMUNITY)
Admission: RE | Admit: 2016-02-10 | Discharge: 2016-02-10 | Disposition: A | Discharge status: Home / Self Care | Payer: Medicare Other | Source: Ambulatory Visit | Attending: Orthopedic Surgery | Admitting: Orthopedic Surgery

## 2016-02-10 ENCOUNTER — Ambulatory Visit (HOSPITAL_COMMUNITY)
Admission: RE | Admit: 2016-02-10 | Discharge: 2016-02-10 | Disposition: A | Discharge status: Home / Self Care | Payer: Medicare Other | Source: Ambulatory Visit | Attending: Anesthesiology | Admitting: Anesthesiology

## 2016-02-10 ENCOUNTER — Encounter (HOSPITAL_COMMUNITY): Payer: Self-pay

## 2016-02-10 DIAGNOSIS — R05 Cough: Secondary | ICD-10-CM | POA: Diagnosis present

## 2016-02-10 DIAGNOSIS — Z01812 Encounter for preprocedural laboratory examination: Secondary | ICD-10-CM | POA: Diagnosis not present

## 2016-02-10 DIAGNOSIS — R059 Cough, unspecified: Secondary | ICD-10-CM

## 2016-02-10 DIAGNOSIS — Z01818 Encounter for other preprocedural examination: Secondary | ICD-10-CM | POA: Insufficient documentation

## 2016-02-10 HISTORY — DX: Unspecified osteoarthritis, unspecified site: M19.90

## 2016-02-10 HISTORY — DX: Polyneuropathy, unspecified: G62.9

## 2016-02-10 HISTORY — DX: Gastro-esophageal reflux disease without esophagitis: K21.9

## 2016-02-10 HISTORY — DX: Insomnia, unspecified: G47.00

## 2016-02-10 HISTORY — DX: Unspecified glaucoma: H40.9

## 2016-02-10 HISTORY — DX: Presence of spectacles and contact lenses: Z97.3

## 2016-02-10 HISTORY — DX: Unspecified urinary incontinence: R32

## 2016-02-10 LAB — CBC
HCT: 41.2 % (ref 36.0–46.0)
Hemoglobin: 13.8 g/dL (ref 12.0–15.0)
MCH: 32.5 pg (ref 26.0–34.0)
MCHC: 33.5 g/dL (ref 30.0–36.0)
MCV: 96.9 fL (ref 78.0–100.0)
PLATELETS: 256 10*3/uL (ref 150–400)
RBC: 4.25 MIL/uL (ref 3.87–5.11)
RDW: 12.6 % (ref 11.5–15.5)
WBC: 7.2 10*3/uL (ref 4.0–10.5)

## 2016-02-10 LAB — BASIC METABOLIC PANEL
Anion gap: 7 (ref 5–15)
BUN: 15 mg/dL (ref 6–20)
CO2: 26 mmol/L (ref 22–32)
CREATININE: 1 mg/dL (ref 0.44–1.00)
Calcium: 9.6 mg/dL (ref 8.9–10.3)
Chloride: 105 mmol/L (ref 101–111)
GFR calc Af Amer: 57 mL/min — ABNORMAL LOW (ref >60)
GFR, EST NON AFRICAN AMERICAN: 49 mL/min — AB (ref >60)
GLUCOSE: 84 mg/dL (ref 65–99)
Potassium: 4.3 mmol/L (ref 3.5–5.1)
SODIUM: 138 mmol/L (ref 135–145)

## 2016-02-10 LAB — SURGICAL PCR SCREEN
MRSA, PCR: NEGATIVE
Staphylococcus aureus: NEGATIVE

## 2016-02-10 NOTE — Progress Notes (Signed)
PCP - Cyndi Bender Cardiologist - denies  EKG - requested CXR - 02/10/16  Echo/stress test/cardiac cath - denies  Patient denies chest pain at PAT appointment.  Patient informed nurse that she has a productive cough that tends to be worse in the morning.  Patient denies fever.

## 2016-02-14 ENCOUNTER — Inpatient Hospital Stay (HOSPITAL_COMMUNITY)
Admission: RE | Admit: 2016-02-14 | Discharge: 2016-02-16 | DRG: 483 | Disposition: A | Discharge status: Home Health | Payer: Medicare Other | Source: Ambulatory Visit | Attending: Orthopedic Surgery | Admitting: Orthopedic Surgery

## 2016-02-14 ENCOUNTER — Inpatient Hospital Stay (HOSPITAL_COMMUNITY): Payer: Medicare Other

## 2016-02-14 ENCOUNTER — Inpatient Hospital Stay (HOSPITAL_COMMUNITY): Payer: Medicare Other | Admitting: Anesthesiology

## 2016-02-14 ENCOUNTER — Encounter (HOSPITAL_COMMUNITY): Payer: Self-pay | Admitting: *Deleted

## 2016-02-14 ENCOUNTER — Inpatient Hospital Stay (HOSPITAL_COMMUNITY): Payer: Medicare Other | Admitting: Vascular Surgery

## 2016-02-14 ENCOUNTER — Encounter (HOSPITAL_COMMUNITY)
Admission: RE | Disposition: A | Discharge status: Home Health | Payer: Self-pay | Source: Ambulatory Visit | Attending: Orthopedic Surgery

## 2016-02-14 DIAGNOSIS — M25511 Pain in right shoulder: Secondary | ICD-10-CM | POA: Diagnosis present

## 2016-02-14 DIAGNOSIS — Z7982 Long term (current) use of aspirin: Secondary | ICD-10-CM | POA: Diagnosis not present

## 2016-02-14 DIAGNOSIS — Z96611 Presence of right artificial shoulder joint: Secondary | ICD-10-CM

## 2016-02-14 DIAGNOSIS — M75101 Unspecified rotator cuff tear or rupture of right shoulder, not specified as traumatic: Secondary | ICD-10-CM | POA: Diagnosis present

## 2016-02-14 DIAGNOSIS — I10 Essential (primary) hypertension: Secondary | ICD-10-CM | POA: Diagnosis present

## 2016-02-14 DIAGNOSIS — Z85820 Personal history of malignant melanoma of skin: Secondary | ICD-10-CM | POA: Diagnosis not present

## 2016-02-14 DIAGNOSIS — M19011 Primary osteoarthritis, right shoulder: Secondary | ICD-10-CM | POA: Diagnosis present

## 2016-02-14 DIAGNOSIS — Z79899 Other long term (current) drug therapy: Secondary | ICD-10-CM

## 2016-02-14 DIAGNOSIS — G2581 Restless legs syndrome: Secondary | ICD-10-CM | POA: Diagnosis present

## 2016-02-14 HISTORY — PX: REVERSE SHOULDER ARTHROPLASTY: SHX5054

## 2016-02-14 SURGERY — ARTHROPLASTY, SHOULDER, TOTAL, REVERSE
Anesthesia: General | Site: Shoulder | Laterality: Right

## 2016-02-14 MED ORDER — FENTANYL CITRATE (PF) 100 MCG/2ML IJ SOLN: 50.0000 ug | Freq: Once | INTRAMUSCULAR | Status: DC

## 2016-02-14 MED ORDER — ONDANSETRON HCL 4 MG/2ML IJ SOLN: 4.0000 mg | Freq: Four times a day (QID) | INTRAMUSCULAR | Status: DC | PRN

## 2016-02-14 MED ORDER — SODIUM CHLORIDE 0.9 % IR SOLN
Status: DC | PRN
  Administered 2016-02-14: 1000 mL

## 2016-02-14 MED ORDER — FENTANYL CITRATE (PF) 100 MCG/2ML IJ SOLN
INTRAMUSCULAR | Status: DC | PRN
  Administered 2016-02-14: 50 ug via INTRAVENOUS

## 2016-02-14 MED ORDER — TRIAMCINOLONE ACETONIDE 0.1 % EX OINT: 1.0000 "application " | TOPICAL_OINTMENT | Freq: Two times a day (BID) | CUTANEOUS | Status: DC

## 2016-02-14 MED ORDER — ASPIRIN EC 81 MG PO TBEC
81.0000 mg | DELAYED_RELEASE_TABLET | Freq: Every day | ORAL | Status: DC
  Administered 2016-02-14 – 2016-02-16 (×3): 81 mg via ORAL
  Filled 2016-02-14 (×3): qty 1

## 2016-02-14 MED ORDER — ATORVASTATIN CALCIUM 40 MG PO TABS
40.0000 mg | ORAL_TABLET | Freq: Every day | ORAL | Status: DC
  Administered 2016-02-14 – 2016-02-15 (×2): 40 mg via ORAL
  Filled 2016-02-14 (×3): qty 1

## 2016-02-14 MED ORDER — FENTANYL CITRATE (PF) 100 MCG/2ML IJ SOLN
25.0000 ug | INTRAMUSCULAR | Status: DC | PRN
  Administered 2016-02-14 (×2): 25 ug via INTRAVENOUS

## 2016-02-14 MED ORDER — TRAZODONE HCL 50 MG PO TABS
50.0000 mg | ORAL_TABLET | Freq: Every day | ORAL | Status: DC
  Administered 2016-02-14 – 2016-02-15 (×2): 50 mg via ORAL
  Filled 2016-02-14 (×2): qty 1

## 2016-02-14 MED ORDER — DOCUSATE SODIUM 100 MG PO CAPS
100.0000 mg | ORAL_CAPSULE | Freq: Two times a day (BID) | ORAL | Status: DC
  Administered 2016-02-14 – 2016-02-16 (×4): 100 mg via ORAL
  Filled 2016-02-14 (×4): qty 1

## 2016-02-14 MED ORDER — ACETAMINOPHEN 500 MG PO TABS: 500.0000 mg | ORAL_TABLET | Freq: Four times a day (QID) | ORAL | Status: DC | PRN

## 2016-02-14 MED ORDER — LOSARTAN POTASSIUM 50 MG PO TABS
50.0000 mg | ORAL_TABLET | Freq: Every day | ORAL | Status: DC
  Administered 2016-02-15 – 2016-02-16 (×2): 50 mg via ORAL
  Filled 2016-02-14 (×2): qty 1

## 2016-02-14 MED ORDER — GABAPENTIN 300 MG PO CAPS
300.0000 mg | ORAL_CAPSULE | Freq: Two times a day (BID) | ORAL | Status: DC
  Administered 2016-02-14 – 2016-02-16 (×4): 300 mg via ORAL
  Filled 2016-02-14 (×4): qty 1

## 2016-02-14 MED ORDER — CHLORHEXIDINE GLUCONATE 4 % EX LIQD: 60.0000 mL | Freq: Once | CUTANEOUS | Status: DC

## 2016-02-14 MED ORDER — METOCLOPRAMIDE HCL 5 MG PO TABS: 5.0000 mg | ORAL_TABLET | Freq: Three times a day (TID) | ORAL | Status: DC | PRN

## 2016-02-14 MED ORDER — BISACODYL 10 MG RE SUPP: 10.0000 mg | Freq: Every day | RECTAL | Status: DC | PRN

## 2016-02-14 MED ORDER — CLINDAMYCIN PHOSPHATE 600 MG/50ML IV SOLN
600.0000 mg | Freq: Four times a day (QID) | INTRAVENOUS | Status: AC
  Administered 2016-02-14 – 2016-02-15 (×3): 600 mg via INTRAVENOUS
  Filled 2016-02-14 (×3): qty 50

## 2016-02-14 MED ORDER — FENTANYL CITRATE (PF) 100 MCG/2ML IJ SOLN
INTRAMUSCULAR | Status: AC
  Filled 2016-02-14: qty 2

## 2016-02-14 MED ORDER — METOCLOPRAMIDE HCL 5 MG/ML IJ SOLN: 10.0000 mg | Freq: Once | INTRAMUSCULAR | Status: DC | PRN

## 2016-02-14 MED ORDER — SUGAMMADEX SODIUM 200 MG/2ML IV SOLN
INTRAVENOUS | Status: DC | PRN
  Administered 2016-02-14: 200 mg via INTRAVENOUS

## 2016-02-14 MED ORDER — HYDROCODONE-ACETAMINOPHEN 5-325 MG PO TABS: 1.0000 | ORAL_TABLET | ORAL | 0 refills | Status: DC | PRN

## 2016-02-14 MED ORDER — PHENOL 1.4 % MT LIQD: 1.0000 | OROMUCOSAL | Status: DC | PRN

## 2016-02-14 MED ORDER — PHENYLEPHRINE HCL 10 MG/ML IJ SOLN
INTRAVENOUS | Status: DC | PRN
  Administered 2016-02-14: 50 ug/min via INTRAVENOUS

## 2016-02-14 MED ORDER — LIDOCAINE HCL (CARDIAC) 20 MG/ML IV SOLN
INTRAVENOUS | Status: DC | PRN
  Administered 2016-02-14: 100 mg via INTRAVENOUS

## 2016-02-14 MED ORDER — LATANOPROST 0.005 % OP SOLN
1.0000 [drp] | Freq: Every day | OPHTHALMIC | Status: DC
  Administered 2016-02-14 – 2016-02-15 (×2): 1 [drp] via OPHTHALMIC
  Filled 2016-02-14: qty 2.5

## 2016-02-14 MED ORDER — MIDAZOLAM HCL 2 MG/2ML IJ SOLN
INTRAMUSCULAR | Status: AC
  Filled 2016-02-14: qty 2

## 2016-02-14 MED ORDER — LACTATED RINGERS IV SOLN
INTRAVENOUS | Status: DC
  Administered 2016-02-14: 11:00:00 via INTRAVENOUS

## 2016-02-14 MED ORDER — SUGAMMADEX SODIUM 200 MG/2ML IV SOLN
INTRAVENOUS | Status: AC
  Filled 2016-02-14: qty 2

## 2016-02-14 MED ORDER — PANTOPRAZOLE SODIUM 40 MG PO TBEC
80.0000 mg | DELAYED_RELEASE_TABLET | Freq: Every day | ORAL | Status: DC
  Administered 2016-02-15 – 2016-02-16 (×2): 80 mg via ORAL
  Filled 2016-02-14 (×2): qty 2

## 2016-02-14 MED ORDER — ACETAMINOPHEN 650 MG RE SUPP: 650.0000 mg | Freq: Four times a day (QID) | RECTAL | Status: DC | PRN

## 2016-02-14 MED ORDER — MENTHOL 3 MG MT LOZG: 1.0000 | LOZENGE | OROMUCOSAL | Status: DC | PRN

## 2016-02-14 MED ORDER — ROCURONIUM BROMIDE 100 MG/10ML IV SOLN
INTRAVENOUS | Status: DC | PRN
  Administered 2016-02-14: 30 mg via INTRAVENOUS

## 2016-02-14 MED ORDER — HYDROCODONE-ACETAMINOPHEN 5-325 MG PO TABS
1.0000 | ORAL_TABLET | ORAL | Status: DC | PRN
  Administered 2016-02-14: 2 via ORAL
  Administered 2016-02-15: 1 via ORAL
  Filled 2016-02-14: qty 2
  Filled 2016-02-14 (×3): qty 1
  Filled 2016-02-14: qty 2

## 2016-02-14 MED ORDER — BUPIVACAINE-EPINEPHRINE (PF) 0.25% -1:200000 IJ SOLN
INTRAMUSCULAR | Status: AC
  Filled 2016-02-14: qty 30

## 2016-02-14 MED ORDER — BUPIVACAINE-EPINEPHRINE (PF) 0.5% -1:200000 IJ SOLN
INTRAMUSCULAR | Status: DC | PRN
  Administered 2016-02-14: 30 mL via PERINEURAL

## 2016-02-14 MED ORDER — CLOBETASOL PROPIONATE 0.05 % EX OINT: 1.0000 "application " | TOPICAL_OINTMENT | Freq: Every day | CUTANEOUS | Status: DC | PRN

## 2016-02-14 MED ORDER — METOCLOPRAMIDE HCL 5 MG/ML IJ SOLN: 5.0000 mg | Freq: Three times a day (TID) | INTRAMUSCULAR | Status: DC | PRN

## 2016-02-14 MED ORDER — BUPIVACAINE-EPINEPHRINE 0.25% -1:200000 IJ SOLN
INTRAMUSCULAR | Status: DC | PRN
  Administered 2016-02-14: 7 mL

## 2016-02-14 MED ORDER — FENTANYL CITRATE (PF) 100 MCG/2ML IJ SOLN
INTRAMUSCULAR | Status: AC
  Administered 2016-02-14: 25 ug via INTRAVENOUS
  Filled 2016-02-14: qty 2

## 2016-02-14 MED ORDER — ACETAMINOPHEN 325 MG PO TABS
650.0000 mg | ORAL_TABLET | Freq: Four times a day (QID) | ORAL | Status: DC | PRN
  Administered 2016-02-15 – 2016-02-16 (×3): 650 mg via ORAL
  Filled 2016-02-14 (×4): qty 2

## 2016-02-14 MED ORDER — PROPOFOL 10 MG/ML IV BOLUS
INTRAVENOUS | Status: DC | PRN
  Administered 2016-02-14: 20 mg via INTRAVENOUS
  Administered 2016-02-14: 110 mg via INTRAVENOUS

## 2016-02-14 MED ORDER — SODIUM CHLORIDE 0.9 % IV SOLN
INTRAVENOUS | Status: DC
  Administered 2016-02-14: 19:00:00 via INTRAVENOUS

## 2016-02-14 MED ORDER — ROCURONIUM BROMIDE 10 MG/ML (PF) SYRINGE
PREFILLED_SYRINGE | INTRAVENOUS | Status: AC
  Filled 2016-02-14: qty 10

## 2016-02-14 MED ORDER — MORPHINE SULFATE (PF) 2 MG/ML IV SOLN: 1.0000 mg | INTRAVENOUS | Status: DC | PRN

## 2016-02-14 MED ORDER — LIDOCAINE 2% (20 MG/ML) 5 ML SYRINGE
INTRAMUSCULAR | Status: AC
  Filled 2016-02-14: qty 5

## 2016-02-14 MED ORDER — ONDANSETRON HCL 4 MG PO TABS: 4.0000 mg | ORAL_TABLET | Freq: Four times a day (QID) | ORAL | Status: DC | PRN

## 2016-02-14 MED ORDER — PROPOFOL 10 MG/ML IV BOLUS
INTRAVENOUS | Status: AC
  Filled 2016-02-14: qty 20

## 2016-02-14 MED ORDER — POLYETHYLENE GLYCOL 3350 17 G PO PACK: 17.0000 g | PACK | Freq: Every day | ORAL | Status: DC | PRN

## 2016-02-14 MED ORDER — CLINDAMYCIN PHOSPHATE 900 MG/50ML IV SOLN
900.0000 mg | INTRAVENOUS | Status: AC
  Administered 2016-02-14: 900 mg via INTRAVENOUS
  Filled 2016-02-14: qty 50

## 2016-02-14 MED ORDER — MEPERIDINE HCL 25 MG/ML IJ SOLN: 6.2500 mg | INTRAMUSCULAR | Status: DC | PRN

## 2016-02-14 MED ORDER — ONDANSETRON HCL 4 MG/2ML IJ SOLN
INTRAMUSCULAR | Status: AC
  Filled 2016-02-14: qty 2

## 2016-02-14 SURGICAL SUPPLY — 71 items
BIT DRILL 170X2.5X (BIT) IMPLANT
BIT DRILL 5/64X5 DISP (BIT) ×2 IMPLANT
BIT DRL 170X2.5X (BIT) ×1
BLADE SAG 18X100X1.27 (BLADE) ×2 IMPLANT
CAPT SHLDR REVTOTAL 1 ×1 IMPLANT
CEMENT BONE DEPUY (Cement) ×1 IMPLANT
COVER SURGICAL LIGHT HANDLE (MISCELLANEOUS) ×2 IMPLANT
DRAPE IMP U-DRAPE 54X76 (DRAPES) ×4 IMPLANT
DRAPE INCISE IOBAN 66X45 STRL (DRAPES) ×2 IMPLANT
DRAPE ORTHO SPLIT 77X108 STRL (DRAPES) ×4
DRAPE SURG ORHT 6 SPLT 77X108 (DRAPES) ×2 IMPLANT
DRAPE U-SHAPE 47X51 STRL (DRAPES) ×2 IMPLANT
DRAPE X-RAY CASS 24X20 (DRAPES) IMPLANT
DRILL 2.5 (BIT) ×2
DRSG ADAPTIC 3X8 NADH LF (GAUZE/BANDAGES/DRESSINGS) ×2 IMPLANT
DRSG PAD ABDOMINAL 8X10 ST (GAUZE/BANDAGES/DRESSINGS) ×2 IMPLANT
DURAPREP 26ML APPLICATOR (WOUND CARE) ×2 IMPLANT
ELECT BLADE 4.0 EZ CLEAN MEGAD (MISCELLANEOUS) ×2
ELECT NDL TIP 2.8 STRL (NEEDLE) ×1 IMPLANT
ELECT NEEDLE TIP 2.8 STRL (NEEDLE) ×2 IMPLANT
ELECT REM PT RETURN 9FT ADLT (ELECTROSURGICAL) ×2
ELECTRODE BLDE 4.0 EZ CLN MEGD (MISCELLANEOUS) ×1 IMPLANT
ELECTRODE REM PT RTRN 9FT ADLT (ELECTROSURGICAL) ×1 IMPLANT
GAUZE SPONGE 4X4 12PLY STRL (GAUZE/BANDAGES/DRESSINGS) ×2 IMPLANT
GLOVE BIOGEL PI ORTHO PRO 7.5 (GLOVE) ×1
GLOVE BIOGEL PI ORTHO PRO SZ8 (GLOVE) ×1
GLOVE ORTHO TXT STRL SZ7.5 (GLOVE) ×2 IMPLANT
GLOVE PI ORTHO PRO STRL 7.5 (GLOVE) ×1 IMPLANT
GLOVE PI ORTHO PRO STRL SZ8 (GLOVE) ×1 IMPLANT
GLOVE SURG ORTHO 8.5 STRL (GLOVE) ×2 IMPLANT
GOWN STRL REUS W/ TWL LRG LVL3 (GOWN DISPOSABLE) ×1 IMPLANT
GOWN STRL REUS W/ TWL XL LVL3 (GOWN DISPOSABLE) ×2 IMPLANT
GOWN STRL REUS W/TWL LRG LVL3 (GOWN DISPOSABLE) ×2
GOWN STRL REUS W/TWL XL LVL3 (GOWN DISPOSABLE) ×4
HANDPIECE INTERPULSE COAX TIP (DISPOSABLE)
KIT BASIN OR (CUSTOM PROCEDURE TRAY) ×2 IMPLANT
KIT ROOM TURNOVER OR (KITS) ×2 IMPLANT
MANIFOLD NEPTUNE II (INSTRUMENTS) ×2 IMPLANT
NDL 1/2 CIR MAYO (NEEDLE) ×1 IMPLANT
NDL HYPO 25GX1X1/2 BEV (NEEDLE) ×1 IMPLANT
NEEDLE 1/2 CIR MAYO (NEEDLE) ×2 IMPLANT
NEEDLE HYPO 25GX1X1/2 BEV (NEEDLE) ×2 IMPLANT
NS IRRIG 1000ML POUR BTL (IV SOLUTION) ×2 IMPLANT
PACK SHOULDER (CUSTOM PROCEDURE TRAY) ×2 IMPLANT
PAD ARMBOARD 7.5X6 YLW CONV (MISCELLANEOUS) ×4 IMPLANT
PIN GUIDE 1.2 (PIN) ×1 IMPLANT
PIN GUIDE GLENOPHERE 1.5MX300M (PIN) ×1 IMPLANT
PIN METAGLENE 2.5 (PIN) ×1 IMPLANT
SET HNDPC FAN SPRY TIP SCT (DISPOSABLE) IMPLANT
SLING ARM LRG ADULT FOAM STRAP (SOFTGOODS) ×1 IMPLANT
SLING ARM MED ADULT FOAM STRAP (SOFTGOODS) IMPLANT
SPACER 38 PLUS 3 (Spacer) ×1 IMPLANT
SPONGE LAP 18X18 X RAY DECT (DISPOSABLE) IMPLANT
SPONGE LAP 4X18 X RAY DECT (DISPOSABLE) ×2 IMPLANT
STRIP CLOSURE SKIN 1/2X4 (GAUZE/BANDAGES/DRESSINGS) ×2 IMPLANT
SUCTION FRAZIER HANDLE 10FR (MISCELLANEOUS) ×1
SUCTION TUBE FRAZIER 10FR DISP (MISCELLANEOUS) ×1 IMPLANT
SUT FIBERWIRE #2 38 T-5 BLUE (SUTURE) ×10
SUT MNCRL AB 4-0 PS2 18 (SUTURE) ×2 IMPLANT
SUT VIC AB 2-0 CT1 27 (SUTURE) ×2
SUT VIC AB 2-0 CT1 TAPERPNT 27 (SUTURE) ×1 IMPLANT
SUT VICRYL 0 CT 1 36IN (SUTURE) ×2 IMPLANT
SUTURE FIBERWR #2 38 T-5 BLUE (SUTURE) ×2 IMPLANT
SYR CONTROL 10ML LL (SYRINGE) ×2 IMPLANT
TAPE CLOTH SURG 6X10 WHT LF (GAUZE/BANDAGES/DRESSINGS) ×1 IMPLANT
TOWEL OR 17X24 6PK STRL BLUE (TOWEL DISPOSABLE) ×2 IMPLANT
TOWEL OR 17X26 10 PK STRL BLUE (TOWEL DISPOSABLE) ×2 IMPLANT
TOWER CARTRIDGE SMART MIX (DISPOSABLE) IMPLANT
TRAY FOLEY CATH 16FRSI W/METER (SET/KITS/TRAYS/PACK) IMPLANT
WATER STERILE IRR 1000ML POUR (IV SOLUTION) ×2 IMPLANT
YANKAUER SUCT BULB TIP NO VENT (SUCTIONS) ×2 IMPLANT

## 2016-02-14 NOTE — Anesthesia Procedure Notes (Signed)
Procedure Name: Intubation Date/Time: 02/14/2016 12:33 PM Performed by: Rush Farmer E Pre-anesthesia Checklist: Patient identified, Emergency Drugs available, Suction available and Patient being monitored Patient Re-evaluated:Patient Re-evaluated prior to inductionOxygen Delivery Method: Circle system utilized Preoxygenation: Pre-oxygenation with 100% oxygen Intubation Type: IV induction Ventilation: Mask ventilation without difficulty Laryngoscope Size: Mac and 3 Grade View: Grade I Tube type: Oral Tube size: 7.0 mm Number of attempts: 1 Airway Equipment and Method: Stylet Placement Confirmation: ETT inserted through vocal cords under direct vision,  positive ETCO2 and breath sounds checked- equal and bilateral Secured at: 20 cm Tube secured with: Tape Dental Injury: Teeth and Oropharynx as per pre-operative assessment

## 2016-02-14 NOTE — Transfer of Care (Signed)
Immediate Anesthesia Transfer of Care Note  Patient: Crystal Vazquez  Procedure(s) Performed: Procedure(s): RIGHT REVERSE SHOULDER ARTHROPLASTY (Right)  Patient Location: PACU  Anesthesia Type:GA combined with regional for post-op pain  Level of Consciousness: awake, alert  and oriented  Airway & Oxygen Therapy: Patient Spontanous Breathing and Patient connected to nasal cannula oxygen  Post-op Assessment: Report given to RN and Post -op Vital signs reviewed and stable  Post vital signs: Reviewed and stable  Last Vitals:  Vitals:   02/14/16 1102  BP: (!) 193/69  Pulse: 63  Resp: 20  Temp: 36.6 C    Last Pain:  Vitals:   02/14/16 1102  TempSrc: Oral      Patients Stated Pain Goal: 3 ([id number])  Complications: No apparent anesthesia complications

## 2016-02-14 NOTE — Interval H&P Note (Signed)
History and Physical Interval Note:  02/14/2016 12:16 PM  Crystal Vazquez  has presented today for surgery, with the diagnosis of [place]  The various methods of treatment have been discussed with the patient and family. After consideration of risks, benefits and other options for treatment, the patient has consented to  Procedure(s): RIGHT REVERSE SHOULDER ARTHROPLASTY (Right) as a surgical intervention .  The patient's history has been reviewed, patient examined, no change in status, stable for surgery.  I have reviewed the patient's chart and labs.  Questions were answered to the patient's satisfaction.     Brent Taillon,STEVEN R

## 2016-02-14 NOTE — Discharge Instructions (Signed)
Ice to the shoulder at all times.  May remove the sling as you would like and do gentle daily activity with the right arm.  Keep the incision clean and dry and covered for one week, then ok to get it wet in the shower.  DO NOT push up with your full weight with the right arm.  Follow up with Dr Veverly Fells in two week in the office [phone]

## 2016-02-14 NOTE — Anesthesia Preprocedure Evaluation (Addendum)
Anesthesia Evaluation  Patient identified by MRN, date of birth, ID band Patient awake    Reviewed: Allergy & Precautions, NPO status , Patient's Chart, lab work & pertinent test results  Airway Mallampati: II  TM Distance: >3 FB Neck ROM: Full    Dental no notable dental hx. (+) Edentulous Upper, Partial Lower   Pulmonary neg pulmonary ROS,    Pulmonary exam normal breath sounds clear to auscultation       Cardiovascular hypertension, Normal cardiovascular exam Rhythm:Regular Rate:Normal     Neuro/Psych negative neurological ROS  negative psych ROS   GI/Hepatic negative GI ROS, Neg liver ROS,   Endo/Other  negative endocrine ROS  Renal/GU negative Renal ROS  negative genitourinary   Musculoskeletal negative musculoskeletal ROS (+)   Abdominal   Peds negative pediatric ROS (+)  Hematology negative hematology ROS (+)   Anesthesia Other Findings   Reproductive/Obstetrics negative OB ROS                            Anesthesia Physical Anesthesia Plan  ASA: II  Anesthesia Plan: General   Post-op Pain Management:  Regional for Post-op pain   Induction: Intravenous  Airway Management Planned: Oral ETT  Additional Equipment:   Intra-op Plan:   Post-operative Plan: Extubation in OR  Informed Consent: I have reviewed the patients History and Physical, chart, labs and discussed the procedure including the risks, benefits and alternatives for the proposed anesthesia with the patient or authorized representative who has indicated his/her understanding and acceptance.   Dental advisory given  Plan Discussed with: CRNA  Anesthesia Plan Comments: (R SCB)        Anesthesia Quick Evaluation

## 2016-02-14 NOTE — Brief Op Note (Signed)
02/14/2016  2:48 PM  PATIENT:  [name]  82 y.o. female  PRE-OPERATIVE DIAGNOSIS:  RIGHT SHOULDER ROTATOR CUFF ARTHROPATHY  POST-OPERATIVE DIAGNOSIS:  RIGHT SHOULDER ROTATOR CUFF ARTHROPATHY  PROCEDURE:  Procedure(s): RIGHT REVERSE SHOULDER ARTHROPLASTY (Right) DePuy Delta Xtend   SURGEON:  Surgeon(s) and Role:    * Netta Cedars, MD - Primary  PHYSICIAN ASSISTANT:   ASSISTANTS: Ventura Bruns, PA-C   ANESTHESIA:   regional and general  EBL:  Total I/O In: 900 [I.V.:900] Out: 200 [Blood:200]  BLOOD ADMINISTERED:none  DRAINS: none   LOCAL MEDICATIONS USED:  MARCAINE     SPECIMEN:  No Specimen  DISPOSITION OF SPECIMEN:n/a  COUNTS:  YES  TOURNIQUET:  * No tourniquets in log *  DICTATION:other dictation [phone]  PLAN OF CARE: Admit to inpatient   PATIENT DISPOSITION:  PACU - hemodynamically stable.   Delay start of Pharmacological VTE agent (>24hrs) due to surgical blood loss or risk of bleeding: no

## 2016-02-14 NOTE — Anesthesia Procedure Notes (Addendum)
Anesthesia Regional Block:  Supraclavicular block  Pre-Anesthetic Checklist: ,, timeout performed, Correct Patient, Correct Site, Correct Laterality, Correct Procedure, Correct Position, site marked, Risks and benefits discussed,  Surgical consent,  Pre-op evaluation,  At surgeon's request and post-op pain management  Laterality: Right and Upper  Prep: Maximum Sterile Barrier Precautions used, chloraprep       Needles:  Injection technique: Single-shot  Needle Type: Echogenic Stimulator Needle     Needle Length: 10cm 10 cm Needle Gauge: 21 G    Additional Needles:  Procedures: ultrasound guided (picture in chart) Supraclavicular block Narrative:  Injection made incrementally with aspirations every 5 mL.  Performed by: Personally  Anesthesiologist: Montez Hageman  Additional Notes: Risks, benefits and alternative to block explained extensively.  Patient tolerated procedure well, without complications.

## 2016-02-15 LAB — BASIC METABOLIC PANEL
ANION GAP: 6 (ref 5–15)
BUN: 10 mg/dL (ref 6–20)
CO2: 28 mmol/L (ref 22–32)
Calcium: 8.8 mg/dL — ABNORMAL LOW (ref 8.9–10.3)
Chloride: 104 mmol/L (ref 101–111)
Creatinine, Ser: 1.06 mg/dL — ABNORMAL HIGH (ref 0.44–1.00)
GFR calc Af Amer: 53 mL/min — ABNORMAL LOW (ref >60)
GFR calc non Af Amer: 46 mL/min — ABNORMAL LOW (ref >60)
GLUCOSE: 94 mg/dL (ref 65–99)
POTASSIUM: 4.3 mmol/L (ref 3.5–5.1)
Sodium: 138 mmol/L (ref 135–145)

## 2016-02-15 LAB — HEMOGLOBIN AND HEMATOCRIT, BLOOD
HCT: 37 % (ref 36.0–46.0)
HEMOGLOBIN: 12.1 g/dL (ref 12.0–15.0)

## 2016-02-15 NOTE — Progress Notes (Signed)
Occupational Therapy Evaluation Patient Details Name: Crystal Vazquez MRN: [id number] DOB: 29-Jul-1926 Today's Date: 02/15/2016    History of Present Illness s/p R reverse TSA   Clinical Impression   PTA, pt independent with ADL and mobility. Began education on HEP and compensatory techniques for ADL. Unable to ambulate pt due to near syncope episode. BP 91/56 - pt c/o being dizzy, lightheaded and "not in control of her body". Pt plans to DC home with elderly husband as caregiver. Pt not safe to DC home today. Will see again after lunch and follow acutely. Husband needs to demonstrate ability to care for his wife as their son plans to go back to [facility] once his Mom is home. Will need HHPT/OT and home health Aide. Nsg aware.     Follow Up Recommendations  Home health OT;Supervision/Assistance - 24 hour;Other (comment) (home health Aide)    Equipment Recommendations  None recommended by OT    Recommendations for Other Services       Precautions / Restrictions Precautions Precautions: Shoulder Type of Shoulder Precautions: conservative. No ROM shoulder. gentle hand to mouth elbow/wrist/hand only. Shoulder Interventions: Shoulder sling/immobilizer Precaution Booklet Issued: Yes (comment) Required Braces or Orthoses: Sling (at all times with the exception of ADL and exercise) Restrictions Weight Bearing Restrictions: Yes RUE Weight Bearing: Non weight bearing      Mobility Bed Mobility               General bed mobility comments: OOB in chair  Transfers Overall transfer level: Needs assistance Equipment used: 1 person hand held assist Transfers: Sit to/from Stand Sit to Stand: Min assist         General transfer comment: lightheaded and dizzy in standing. Unable to ambulatwe    Balance    LOB posteriorly. Physical assist to keep pt from falling.                                        ADL Overall ADL's : Needs  assistance/impaired Eating/Feeding: Set up   Grooming: Moderate assistance   Upper Body Bathing: Moderate assistance   Lower Body Bathing: Moderate assistance   Upper Body Dressing : Maximal assistance   Lower Body Dressing: Moderate assistance               Functional mobility during ADLs: Minimal assistance (sit - stand only) General ADL Comments: Began education on compensatoy techniques     Vision     Perception     Praxis      Pertinent Vitals/Pain Pain Assessment: 0-10 Pain Score: 8  Pain Location: R shoulder Pain Descriptors / Indicators: Aching;Sore Pain Intervention(s): Limited activity within patient's tolerance;Ice applied;Repositioned     Hand Dominance Right   Extremity/Trunk Assessment Upper Extremity Assessment Upper Extremity Assessment: RUE deficits/detail RUE Deficits / Details: painful. block wearing off. able to complete AAROM R elbow flex/ext [facility]. wrist/hand ROM WFL   Lower Extremity Assessment Lower Extremity Assessment: Overall WFL for tasks assessed (stiffness)   Cervical / Trunk Assessment Cervical / Trunk Assessment: Other exceptions (chronic back problems)   Communication Communication Communication: No difficulties   Cognition Arousal/Alertness: Awake/alert Behavior During Therapy: WFL for tasks assessed/performed Overall Cognitive Status: Within Functional Limits for tasks assessed                     General Comments       Exercises  Exercises: Shoulder     Shoulder Instructions Shoulder Instructions Donning/doffing shirt without moving shoulder: Moderate assistance Method for sponge bathing under operated UE: Moderate assistance Donning/doffing sling/immobilizer: Moderate assistance ROM for elbow, wrist and digits of operated UE: Supervision/safety Sling wearing schedule (on at all times/off for ADL's): Moderate assistance Positioning of UE while sleeping: Moderate assistance    Home Living Family/patient  expects to be discharged to:: Private residence Living Arrangements: Spouse/significant other Available Help at Discharge: Family;Available 24 hours/day Type of Home: House Home Access: Stairs to enter [employer] of Steps: 4 Entrance Stairs-Rails: Right;Left Home Layout: One level     Bathroom Shower/Tub: Tub/shower unit Shower/tub characteristics: Architectural technologist: Standard Bathroom Accessibility: Yes How Accessible: Accessible via walker (sideways) Home Equipment: Toilet riser;Shower seat          Prior Functioning/Environment Level of Independence: Independent                 OT Problem List: Decreased strength;Decreased range of motion;Decreased activity tolerance;Impaired balance (sitting and/or standing);Decreased coordination;Decreased safety awareness;Decreased knowledge of use of DME or AE;Decreased knowledge of precautions;Impaired UE functional use;Pain   OT Treatment/Interventions: Self-care/ADL training;Therapeutic exercise;DME and/or AE instruction;Therapeutic activities;Patient/family education    OT Goals(Current goals can be found in the care plan section) Acute Rehab OT Goals Patient Stated Goal: to go home OT Goal Formulation: With patient Time For Goal Achievement: 02/22/16 Potential to Achieve Goals: Good  OT Frequency: Min 3X/week   Barriers to D/C: Decreased caregiver support          Co-evaluation              End of Session Equipment Utilized During Treatment: Gait belt Nurse Communication: Mobility status;Precautions;Weight bearing status;Other (comment) (BP/medical issues)  Activity Tolerance: Treatment limited secondary to medical complications (Comment) (near syncope episode) Patient left: in chair;with call bell/phone within reach;with family/visitor present   Time: [id number] OT Time Calculation (min): 44 min Charges:  OT General Charges $OT Visit: 1 Procedure OT Evaluation $OT Eval Moderate Complexity:  1 Procedure OT Treatments $Self Care/Home Management : 8-22 mins $Therapeutic Exercise: 8-22 mins G-Codes:    Macrina Lehnert,HILLARY March 09, 2016, 9:23 AM   Maurie Boettcher, OTR/L  [phone] Mar 09, 2016

## 2016-02-15 NOTE — Progress Notes (Signed)
Crystal Vazquez  MRN: [id number] DOB/Age: 1926-08-27 82 y.o. Physician: Veverly Fells Procedure: Procedure(s) (LRB): RIGHT REVERSE SHOULDER ARTHROPLASTY (Right)     Subjective: Up in chair, but had near syncope episode after taking pain meds and BP being a little low. Has had more pain after block wore off. Awake and alert now  Vital Signs Temp:  [97.4 F (36.3 C)-98.6 F (37 C)] 98.6 F (37 C) (12/02 0500) Pulse Rate:  [55-86] 86 (12/02 0810) Resp:  [15-26] 16 (12/02 0810) BP: (103-193)/(54-77) 155/60 (12/02 0810) SpO2:  [95 %-100 %] 96 % (12/02 0810) Weight:  [72.6 kg (160 lb)] 72.6 kg (160 lb) (12/01 1102)  Lab Results  Recent Labs  02/15/16 0617  HGB 12.1  HCT 37.0   BMET  Recent Labs  02/15/16 0617  NA 138  K 4.3  CL 104  CO2 28  GLUCOSE 94  BUN 10  CREATININE 1.06*  CALCIUM 8.8*   INR  Date Value Ref Range Status  01/11/2011 0.95 0.00 - 1.49 Final     Exam Right shoulder dressing bulky but dry        Plan Will keep til tomorrow to get pain under better control and make sure tolerating pain meds. Continue to mobilize but use as few narcotics as possible  Britteny Fiebelkorn PA-C   02/15/2016, 10:29 AM Contact # [phone]

## 2016-02-15 NOTE — Progress Notes (Signed)
Occupational Therapy Treatment Patient Details Name: Crystal Vazquez MRN: [id number] DOB: November 13, 1926 Today's Date: 02/15/2016    History of present illness s/p R reverse TSA   OT comments  Pt tolerated second session well. Able to ambulate @ unit. No complaints of dizziness. HHA due to unsteadiness. Recommend pt ambulate with nsg staff this evening. Will follow up tomorrow.   Follow Up Recommendations  Home health OT;Other (comment) (HH Aide; HHPT)    Equipment Recommendations  None recommended by OT    Recommendations for Other Services      Precautions / Restrictions Precautions Precautions: Shoulder Type of Shoulder Precautions: conservative. No ROM shoulder. gentle hand to mouth elbow/wrist/hand only. Shoulder Interventions: Shoulder sling/immobilizer Precaution Booklet Issued: Yes (comment) Required Braces or Orthoses: Sling Restrictions RUE Weight Bearing: Non weight bearing       Mobility Bed Mobility Overal bed mobility: Needs Assistance Bed Mobility: Sit to Supine       Sit to supine: Mod assist (lift BLE)      Transfers Overall transfer level: Needs assistance Equipment used: 1 person hand held assist Transfers: Sit to/from Stand Sit to Stand: Min assist              Balance                                   ADL                                                Vision                     Perception     Praxis      Cognition   Behavior During Therapy: [facility] for tasks assessed/performed Overall Cognitive Status: Within Functional Limits for tasks assessed                       Extremity/Trunk Assessment               Exercises Shoulder Exercises Elbow Flexion: AAROM;Right;10 reps;Seated Elbow Extension: AAROM;Right;10 reps;Seated Wrist Flexion: AROM;Right;10 reps Wrist Extension: AROM;Right;10 reps Digit Composite Flexion: AROM;Right;10 reps Composite Extension:  AROM;Right;10 reps   Shoulder Instructions       General Comments      Pertinent Vitals/ Pain       Pain Assessment: 0-10 Pain Score: 6  Pain Location: R shoulder Pain Descriptors / Indicators: Aching Pain Intervention(s): Limited activity within patient's tolerance;Ice applied;Repositioned  Home Living                                          Prior Functioning/Environment              Frequency  Min 3X/week        Progress Toward Goals  OT Goals(current goals can now be found in the care plan section)  Progress towards OT goals: Progressing toward goals  Acute Rehab OT Goals Patient Stated Goal: to go home OT Goal Formulation: With patient Time For Goal Achievement: 02/22/16 Potential to Achieve Goals: Good  Plan Discharge plan remains appropriate    Co-evaluation  End of Session Equipment Utilized During Treatment: Gait belt   Activity Tolerance Patient tolerated treatment well   Patient Left in bed;with call bell/phone within reach;with bed alarm set;with family/visitor present   Nurse Communication Mobility status        Time: [id number] OT Time Calculation (min): 22 min  Charges: OT General Charges $OT Visit: 1 Procedure OT Treatments $Self Care/Home Management : 8-22 mins  Chibuike Fleek,HILLARY 02/15/2016, 3:32 PM   [facility], OTR/L  [phone] 02/15/2016

## 2016-02-16 NOTE — Progress Notes (Signed)
Discharge instructions and prescription provided to patient.  IV removed.  Patient has misplaced her eyeglasses.  She believes she may have placed her eyeglasses on her breakfast tray.  [facility] notified, trays have not made it downstairs at this time.  They will look and call back.  Room checked and eyeglasses not found.  Will continue to monitor the situation.

## 2016-02-16 NOTE — Progress Notes (Signed)
Occupational Therapy Treatment Patient Details Name: Crystal Vazquez MRN: [id number] DOB: 1927-02-17 Today's Date: 02/16/2016    History of present illness s/p R reverse TSA   OT comments  Completed education with pt/husband regarding ADL and shoulder precautions, reduing risk of falls, HPE for RUE. Elderly husband will be primary caregiver as their son is going back home after pt is DC home. Recommend HHOT/PT and home health aide to maximize pt's level of independence and facilitate safe recovery. Discussed with nsg.   Follow Up Recommendations  Home health OT;Other (comment)    Equipment Recommendations  None recommended by OT    Recommendations for Other Services      Precautions / Restrictions Precautions Precautions: Shoulder Type of Shoulder Precautions: conservative. No ROM shoulder. gentle hand to mouth elbow/wrist/hand only. Shoulder Interventions: Shoulder sling/immobilizer Precaution Booklet Issued: Yes (comment) Required Braces or Orthoses: Sling Restrictions Weight Bearing Restrictions: Yes RUE Weight Bearing: Non weight bearing       Mobility Bed Mobility               General bed mobility comments: OOB in chair  Transfers Overall transfer level: Needs assistance Equipment used: 1 person hand held assist   Sit to Stand: Supervision              Balance Overall balance assessment: Needs assistance           Standing balance-Leahy Scale: Fair                     ADL           Upper Body Bathing: Minimal assitance   Lower Body Bathing: Min guard   Upper Body Dressing : Moderate assistance   Lower Body Dressing: Minimal assistance               Functional mobility during ADLs: Min guard (HHA) General ADL Comments: Completed ADL session wtih husband to teach him how to help his wife with ADL. Pt completing bathing and dressing with min A overall. Pt demosntrated good insight into safety and how to manage her RUE  during ADL.       Vision                     Perception     Praxis      Cognition   Behavior During Therapy: [facility] for tasks assessed/performed Overall Cognitive Status: Within Functional Limits for tasks assessed                       Extremity/Trunk Assessment               Exercises Shoulder Exercises Elbow Flexion: AAROM;Right;10 reps;Seated Elbow Extension: AAROM;Right;10 reps;Seated Wrist Flexion: AROM;Right;10 reps;Seated Wrist Extension: AROM;Right;10 reps;Seated Digit Composite Flexion: AROM;Right;Seated Composite Extension: AROM;Right;Seated Donning/doffing shirt without moving shoulder: Patient able to independently direct caregiver Method for sponge bathing under operated UE: Patient able to independently direct caregiver Donning/doffing sling/immobilizer: Patient able to independently direct caregiver Correct positioning of sling/immobilizer: Patient able to independently direct caregiver ROM for elbow, wrist and digits of operated UE: Supervision/safety Sling wearing schedule (on at all times/off for ADL's): Supervision/safety Proper positioning of operated UE when showering: Set-up Positioning of UE while sleeping: Supervision/safety   Shoulder Instructions Shoulder Instructions Donning/doffing shirt without moving shoulder: Patient able to independently direct caregiver Method for sponge bathing under operated UE: Patient able to independently direct caregiver Donning/doffing sling/immobilizer: Patient able to independently direct  caregiver Correct positioning of sling/immobilizer: Patient able to independently direct caregiver ROM for elbow, wrist and digits of operated UE: Supervision/safety Sling wearing schedule (on at all times/off for ADL's): Supervision/safety Proper positioning of operated UE when showering: Set-up Positioning of UE while sleeping: Supervision/safety     General Comments      Pertinent Vitals/ Pain        Pain Assessment: 0-10 Pain Score: 5  Pain Location: R shoulder Pain Descriptors / Indicators: Aching Pain Intervention(s): Limited activity within patient's tolerance;Repositioned  Home Living                                          Prior Functioning/Environment              Frequency           Progress Toward Goals  OT Goals(current goals can now be found in the care plan section)  Progress towards OT goals: Goals met/education completed, patient discharged from OT (continue with [facility])  Acute Rehab OT Goals Patient Stated Goal: to go home OT Goal Formulation: With patient Time For Goal Achievement: 02/22/16 Potential to Achieve Goals: Good ADL Goals Pt Will Perform Upper Body Bathing: with supervision;with caregiver independent in assisting Pt Will Perform Lower Body Bathing: with supervision;with caregiver independent in assisting Pt Will Perform Upper Body Dressing: with supervision;with caregiver independent in assisting Pt Will Perform Lower Body Dressing: with supervision;with caregiver independent in assisting Pt Will Transfer to Toilet: with supervision;ambulating Pt/caregiver will Perform Home Exercise Program: Increased ROM;Right Upper extremity;With written HEP provided (per protocol)  Plan Discharge plan remains appropriate    Co-evaluation                 End of Session     Activity Tolerance Patient tolerated treatment well   Patient Left in chair;with call bell/phone within reach;with family/visitor present   Nurse Communication Mobility status        Time: 0930-1005 OT Time Calculation (min): 35 min  Charges: OT General Charges $OT Visit: 1 Procedure OT Treatments $Self Care/Home Management : 23-37 mins  Kalen Ratajczak,HILLARY 02/16/2016, 11:27 AM   Maurie Boettcher, OTR/L  [phone] 02/16/2016

## 2016-02-16 NOTE — Progress Notes (Signed)
   Subjective:  Patient reports pain as mild to moderate.  Wants to go home.  Objective:   VITALS:   Vitals:   02/15/16 0810 02/15/16 1533 02/15/16 2004 02/16/16 0541  BP: (!) 155/60 (!) 114/48 (!) 155/65 137/65  Pulse: 86 77 81 89  Resp: 16 16 16 16   Temp:  97.8 F (36.6 C) 98.3 F (36.8 C) 98.4 F (36.9 C)  TempSrc:  Oral Oral Oral  SpO2: 96% 96% 94% 92%  Weight:        Neurologically intact Sensation intact distally Intact pulses distally Incision: dressing C/D/I (+) AIN, PIN, U  Lab Results  Component Value Date   WBC 7.2 02/10/2016   HGB 12.1 02/15/2016   HCT 37.0 02/15/2016   MCV 96.9 02/10/2016   PLT 256 02/10/2016   BMET    Component Value Date/Time   NA 138 02/15/2016 0617   K 4.3 02/15/2016 0617   CL 104 02/15/2016 0617   CO2 28 02/15/2016 0617   GLUCOSE 94 02/15/2016 0617   BUN 10 02/15/2016 0617   CREATININE 1.06 (H) 02/15/2016 0617   CALCIUM 8.8 (L) 02/15/2016 0617   GFRNONAA 46 (L) 02/15/2016 0617   GFRAA 53 (L) 02/15/2016 0617     Assessment/Plan: 2 Days Post-Op   Active Problems:   S/P shoulder replacement, right   TSA protocol Home today   Pavel Gadd, Horald Pollen 02/16/2016, 7:49 AM   Rod Can, MD Cell [phone]

## 2016-02-16 NOTE — Care Management Note (Signed)
Case Management Note  Patient Details  Name: Crystal Vazquez MRN: [id number] Date of Birth: May 02, 1926  Subjective/Objective:                  RIGHT REVERSE SHOULDER ARTHROPLASTY   Action/Plan: Received a call from patient's nurse stating the patient has been discharged. She states she received a call from the provider after discharge requesting home health services for the patient. CM spoke with the patient who selects [facility] for home health PT and OT. Jermaine at [facility] notified of the home health order and informed the patient has been discharged.   Expected Discharge Date:     02/16/16             Expected Discharge Plan:  [place]  In-House Referral:     Discharge planning Services  CM Consult  Post Acute Care Choice:    Choice offered to:  Patient  MRY Arranged:  N/A DME Agency:  NA  HH Arranged:  PT, OT HH Agency:  [facility]  Status of Service:  Completed, signed off  If discussed at [place] of Stay Meetings, dates discussed:    Additional Comments:  Apolonio Schneiders, RN 02/16/2016, 12:43 PM

## 2016-02-16 NOTE — Progress Notes (Signed)
Patient found glasses, her husband had placed them in is shirt pocket.

## 2016-02-17 ENCOUNTER — Encounter (HOSPITAL_COMMUNITY): Payer: Self-pay | Admitting: Orthopedic Surgery

## 2016-02-17 NOTE — Anesthesia Postprocedure Evaluation (Signed)
Anesthesia Post Note  Patient: Crystal Vazquez  Procedure(s) Performed: Procedure(s) (LRB): RIGHT REVERSE SHOULDER ARTHROPLASTY (Right)  Patient location during evaluation: PACU Anesthesia Type: General and Regional Level of consciousness: awake and alert Pain management: pain level controlled Vital Signs Assessment: post-procedure vital signs reviewed and stable Respiratory status: spontaneous breathing, nonlabored ventilation, respiratory function stable and patient connected to nasal cannula oxygen Cardiovascular status: blood pressure returned to baseline and stable Postop Assessment: no signs of nausea or vomiting Anesthetic complications: no    Last Vitals:  Vitals:   02/16/16 0541 02/16/16 1004  BP: 137/65 (!) 176/86  Pulse: 89   Resp: 16   Temp: 36.9 C     Last Pain:  Vitals:   02/16/16 1004  TempSrc:   PainSc: 3                  Montez Hageman

## 2016-02-17 NOTE — Addendum Note (Signed)
Addendum  created 02/17/16 1451 by Montez Hageman, MD   Anesthesia Intra Blocks edited, Sign clinical note

## 2016-02-17 NOTE — Op Note (Signed)
Crystal Vazquez, Crystal Vazquez              ACCOUNT NO.:  [id number]  MEDICAL RECORD NO.:  [id number]  LOCATION:                               FACILITY:  [facility]  PHYSICIAN:  Doran Heater. Veverly Fells, M.D. DATE OF BIRTH:  September 13, 1926  DATE OF PROCEDURE:  02/14/2016 DATE OF DISCHARGE:  02/16/2016                              OPERATIVE REPORT   PREOPERATIVE DIAGNOSIS:  Right shoulder end-stage arthritis/rotator cuff tear arthropathy.  POSTOPERATIVE DIAGNOSIS:  Right shoulder end-stage arthritis/rotator cuff tear arthropathy.  PROCEDURE PERFORMED:  Right shoulder reverse total shoulder arthroplasty using DePuy Delta Xtend prosthesis.  ATTENDING SURGEON:  Doran Heater. Veverly Fells, M.D.  ASSISTANT:  Abbott Pao. Dixon, PA-C, who scrubbed the entire procedure and necessary for satisfactory completion of surgery.  ANESTHESIA:  General anesthesia was used plus interscalene block.  ESTIMATED BLOOD LOSS:  Minimal.  There was 200 mL.  FLUID REPLACED:  1200 mL crystalloid.  INSTRUMENT COUNTS:  Correct.  COMPLICATIONS:  There were no complications.  ANTIBIOTICS:  Perioperative antibiotics were given.  INDICATIONS:  The patient is an 82 year old female with worsening right shoulder pain secondary to rotator cuff tear arthropathy.  The patient has had evidence of some slight superior migration and joint space loss. She has had progressive pain despite conservative management and desires operative treatment to restore function and eliminate pain.  Informed consent obtained.  DESCRIPTION OF PROCEDURE:  After an adequate level of anesthesia was achieved, the patient was positioned in modified beach-chair position. Right shoulder was correctly identified, sterilely prepped and draped in usual manner.  Time-out was called.  We entered the shoulder in standard deltopectoral incision, started at the coracoid process extending down to the anterior humerus.  Dissection carried out down through subcutaneous tissues.  The  subscapularis was taken subperiosteally off the lesser tuberosity and tagged for repair.  We progressively externally rotated the humerus and released the inferior capsule.  I then tenodesed the biceps in situ with 2 figure-of-eight 0 Vicryl sutures into the pec insertion.  We then went ahead and released the biceps tendon proximally and also the remaining supraspinatus.  There was a little strip of supraspinatus remaining.  No infraspinatus or back half of the supraspinatus was gone.  Teres minor was intact posteriorly. Once we released that anterior strip, we were able to easily visualize the superior humerus, placed our Biochemist, clinical and our Psychologist, clinical.  We then entered the proximal humerus with a 6 mm reamer, reamed up to a size 10 mm reamer and then went ahead and placed our intramedullary head resection guide.  Once that was in place, we resected the head 10 degrees retroversion for the DePuy Delta Xtend prosthesis.  Next, we removed excess osteophytes with a rongeur.  We then went ahead and reamed for the proximal metaphyseal component and this was an epi 1 right metaphyseal component.  Once we had reamed for that, we placed our trial component which was the 10 body and epi 1 right set on the 0 setting and placed in 10 degrees retroversion.  We retracted that entire humeral construct posteriorly and then did a 360- degree capsular removal and also removed the remaining biceps and superior labrum and also the  remnant of the supraspinatus.  Once that was out, we had excellent visualization anteriorly and posteriorly, placed our central guide pin.  We reamed for the metaglene and then drilled our central peg hole and impacted the metaglene in position, placed a 30 screw inferiorly a 36 at the base coracoid and an 18 nonlocked anteriorly.  We had good purchase with our screws and good secure fixation.  The base plate just barely fit on this very small glenoid.  Once we had that  secured, we went ahead and placed a 38 standard glenosphere into position and screwed that and impacted that home.  Once that was secure, we reduced the shoulder with a 38+3 trial. We felt like that would be appropriate.  We removed the trial components on the humeral side.  We irrigated thoroughly.  We then placed drill holes and #2 FiberWire suture for repair of the subscap.  We then used one package of DePuy high viscosity cement and vacuum mixed that and then placed a small amount of that into the metaphyseal portion of the humeral shaft after irrigating and drying and then we took a press-fit stem 10 body epi-1 right, set on 0 setting, placed in 10 degrees retroversion, and impacted that in position with the appropriate version.  Once the cement was allowed to harden, we trialed with +6 poly, 38+6 and once that was chosen and secured in place, we reduced the shoulder.  At this point, with the shoulder reduced and we went ahead and repaired the subscapularis anatomically back to bone with the FiberWire through bone and we had nice secure repair.  If it did not limit external rotation, we could easily get to 30 degrees external rotation and forward elevation beyond 90, no impingement.  We thoroughly irrigated again, repaired deltopectoral interval with 0 Vicryl suture followed by 2-0 Vicryl for subcutaneous closure and 4-0 Monocryl for skin.  Steri-Strips applied followed by sterile dressing.  The patient tolerated the surgery well.     Doran Heater. Veverly Fells, M.D.   ______________________________ Doran Heater. Veverly Fells, M.D.    SRN/MEDQ  D:  02/14/2016  T:  02/15/2016  Job:  [id number]

## 2016-02-18 NOTE — Discharge Summary (Signed)
Physician Discharge Summary   Patient ID: Crystal Vazquez MRN: [id number] DOB/AGE: 1927/01/21 82 y.o.  Admit date: 02/14/2016 Discharge date: 02/18/2016  Admission Diagnoses:  Active Problems:   S/P shoulder replacement, right   Discharge Diagnoses:  Same   Surgeries: Procedure(s): RIGHT REVERSE SHOULDER ARTHROPLASTY on 02/14/2016   Consultants: PT/OT  Discharged Condition: Stable  Hospital Course: Crystal Vazquez is an 82 y.o. female who was admitted 02/14/2016 with a chief complaint of right shoulder pain, and found to have a diagnosis of right rotator cuff arthropathy.  They were brought to the operating room on 02/14/2016 and underwent the above named procedures.    The patient had an uncomplicated hospital course and was stable for discharge.  Recent vital signs:  Vitals:   02/16/16 0541 02/16/16 1004  BP: 137/65 (!) 176/86  Pulse: 89   Resp: 16   Temp: 98.4 F (36.9 C)     Recent laboratory studies:  Results for orders placed or performed during the hospital encounter of 02/14/16  Hemoglobin and hematocrit, blood  Result Value Ref Range   Hemoglobin 12.1 12.0 - 15.0 g/dL   HCT 37.0 36.0 - [id number] %  Basic metabolic panel  Result Value Ref Range   Sodium 138 135 - 145 mmol/L   Potassium 4.3 3.5 - 5.1 mmol/L   Chloride 104 101 - 111 mmol/L   CO2 28 22 - 32 mmol/L   Glucose, Bld 94 65 - 99 mg/dL   BUN 10 6 - 20 mg/dL   Creatinine, Ser 1.06 (H) 0.44 - 1.00 mg/dL   Calcium 8.8 (L) 8.9 - 10.3 mg/dL   GFR calc non Af Amer 46 (L) >60 mL/min   GFR calc Af Amer 53 (L) >60 mL/min   Anion gap 6 5 - 15    Discharge Medications:     Medication List    TAKE these medications   acetaminophen 500 MG tablet Commonly known as:  TYLENOL Take 500 mg by mouth every 6 (six) hours as needed for mild pain.   aspirin 81 MG tablet Take 81 mg by mouth daily.   atorvastatin 40 MG tablet Commonly known as:  LIPITOR Take 40 mg by mouth daily at 6 PM.   clobetasol  ointment 0.05 % Commonly known as:  TEMOVATE Apply 1 application topically daily as needed (ITCHING).   gabapentin 300 MG capsule Commonly known as:  NEURONTIN Take 300 mg by mouth 2 (two) times daily.   HYDROcodone-acetaminophen 5-325 MG tablet Commonly known as:  NORCO Take 1 tablet by mouth every 4 (four) hours as needed for moderate pain.   latanoprost 0.005 % ophthalmic solution Commonly known as:  XALATAN Place 1 drop into the left eye at bedtime.   losartan 25 MG tablet Commonly known as:  COZAAR Take 50 mg by mouth daily.   omeprazole 40 MG capsule Commonly known as:  PRILOSEC Take 40 mg by mouth daily.   traZODone 50 MG tablet Commonly known as:  DESYREL Take 1 tablet by mouth at bedtime.   triamcinolone ointment 0.1 % Commonly known as:  KENALOG Apply 1 application topically 2 (two) times daily.   zolpidem 5 MG tablet Commonly known as:  AMBIEN Take 5 mg by mouth at bedtime.       Diagnostic Studies: Dg Chest 2 View  Result Date: 02/10/2016 CLINICAL DATA:  Right shoulder arthroplasty. EXAM: CHEST  2 VIEW COMPARISON:  06/17/2011 . FINDINGS: Mediastinum and hilar structures are normal. Heart size normal. No focal infiltrate.  Bilateral pleural parenchymal thickening noted consistent with scarring. No pleural effusion or pneumothorax. Thoracic spine scoliosis. Calcifications right axilla most likely calcified lymph nodes. IMPRESSION: No acute cardiopulmonary disease. Bilateral pleural parenchymal thickening consistent with scarring. Electronically Signed   By: Marcello Moores  Register   On: 02/10/2016 11:03   Dg Shoulder Right Port  Result Date: 02/14/2016 CLINICAL DATA:  Right shoulder replacement EXAM: PORTABLE RIGHT SHOULDER COMPARISON:  Chest x-ray 02/10/2016 FINDINGS: Total right shoulder replacement in satisfactory position and alignment. No fracture or complication. IMPRESSION: Satisfactory right shoulder replacement. Electronically Signed   By: Franchot Gallo M.D.    On: 02/14/2016 16:37    Disposition: 01-Home or Self Care  Discharge Instructions    Call MD / Call 911    Complete by:  As directed    If you experience chest pain or shortness of breath, CALL 911 and be transported to the hospital emergency room.  If you develope a fever above 101 F, pus (white drainage) or increased drainage or redness at the wound, or calf pain, call your surgeon's office.   Constipation Prevention    Complete by:  As directed    Drink plenty of fluids.  Prune juice may be helpful.  You may use a stool softener, such as Colace (over the counter) 100 mg twice a day.  Use MiraLax (over the counter) for constipation as needed.   Diet - low sodium heart healthy    Complete by:  As directed    Discharge instructions    Complete by:  As directed    Follow all of Dr. Veverly Fells' postop instructions   Driving restrictions    Complete by:  As directed    No driving for 6 weeks   Increase activity slowly as tolerated    Complete by:  As directed    Lifting restrictions    Complete by:  As directed    No lifting for 6 weeks      Follow-up Information    NORRIS,STEVEN R, MD. Call in 2 week(s).   Specialty:  Orthopedic Surgery Why:  [id number] Contact information: [address] [place] [address] [phone]            Signed: Ventura Bruns 02/18/2016, 10:57 AM

## 2016-03-16 DIAGNOSIS — Z96611 Presence of right artificial shoulder joint: Secondary | ICD-10-CM | POA: Diagnosis not present

## 2016-03-16 DIAGNOSIS — G2581 Restless legs syndrome: Secondary | ICD-10-CM | POA: Diagnosis not present

## 2016-03-16 DIAGNOSIS — Z85828 Personal history of other malignant neoplasm of skin: Secondary | ICD-10-CM | POA: Diagnosis not present

## 2016-03-16 DIAGNOSIS — I10 Essential (primary) hypertension: Secondary | ICD-10-CM | POA: Diagnosis not present

## 2016-03-16 DIAGNOSIS — Z471 Aftercare following joint replacement surgery: Secondary | ICD-10-CM | POA: Diagnosis not present

## 2016-03-16 DIAGNOSIS — Z7982 Long term (current) use of aspirin: Secondary | ICD-10-CM | POA: Diagnosis not present

## 2016-03-16 DIAGNOSIS — Z85820 Personal history of malignant melanoma of skin: Secondary | ICD-10-CM | POA: Diagnosis not present

## 2016-03-17 DIAGNOSIS — Z471 Aftercare following joint replacement surgery: Secondary | ICD-10-CM | POA: Diagnosis not present

## 2016-03-17 DIAGNOSIS — Z85820 Personal history of malignant melanoma of skin: Secondary | ICD-10-CM | POA: Diagnosis not present

## 2016-03-17 DIAGNOSIS — I10 Essential (primary) hypertension: Secondary | ICD-10-CM | POA: Diagnosis not present

## 2016-03-17 DIAGNOSIS — Z96611 Presence of right artificial shoulder joint: Secondary | ICD-10-CM | POA: Diagnosis not present

## 2016-03-17 DIAGNOSIS — Z7982 Long term (current) use of aspirin: Secondary | ICD-10-CM | POA: Diagnosis not present

## 2016-03-17 DIAGNOSIS — G2581 Restless legs syndrome: Secondary | ICD-10-CM | POA: Diagnosis not present

## 2016-03-17 DIAGNOSIS — Z85828 Personal history of other malignant neoplasm of skin: Secondary | ICD-10-CM | POA: Diagnosis not present

## 2016-03-19 DIAGNOSIS — G2581 Restless legs syndrome: Secondary | ICD-10-CM | POA: Diagnosis not present

## 2016-03-19 DIAGNOSIS — Z96611 Presence of right artificial shoulder joint: Secondary | ICD-10-CM | POA: Diagnosis not present

## 2016-03-19 DIAGNOSIS — Z85828 Personal history of other malignant neoplasm of skin: Secondary | ICD-10-CM | POA: Diagnosis not present

## 2016-03-19 DIAGNOSIS — Z85820 Personal history of malignant melanoma of skin: Secondary | ICD-10-CM | POA: Diagnosis not present

## 2016-03-19 DIAGNOSIS — I10 Essential (primary) hypertension: Secondary | ICD-10-CM | POA: Diagnosis not present

## 2016-03-19 DIAGNOSIS — Z471 Aftercare following joint replacement surgery: Secondary | ICD-10-CM | POA: Diagnosis not present

## 2016-03-19 DIAGNOSIS — Z7982 Long term (current) use of aspirin: Secondary | ICD-10-CM | POA: Diagnosis not present

## 2016-03-26 DIAGNOSIS — Z96611 Presence of right artificial shoulder joint: Secondary | ICD-10-CM | POA: Diagnosis not present

## 2016-03-30 DIAGNOSIS — Z96611 Presence of right artificial shoulder joint: Secondary | ICD-10-CM | POA: Diagnosis not present

## 2016-03-30 DIAGNOSIS — R531 Weakness: Secondary | ICD-10-CM | POA: Diagnosis not present

## 2016-03-30 DIAGNOSIS — M25511 Pain in right shoulder: Secondary | ICD-10-CM | POA: Diagnosis not present

## 2016-03-30 DIAGNOSIS — M25611 Stiffness of right shoulder, not elsewhere classified: Secondary | ICD-10-CM | POA: Diagnosis not present

## 2016-04-06 DIAGNOSIS — Z96611 Presence of right artificial shoulder joint: Secondary | ICD-10-CM | POA: Diagnosis not present

## 2016-04-06 DIAGNOSIS — M25611 Stiffness of right shoulder, not elsewhere classified: Secondary | ICD-10-CM | POA: Diagnosis not present

## 2016-04-06 DIAGNOSIS — M25511 Pain in right shoulder: Secondary | ICD-10-CM | POA: Diagnosis not present

## 2016-04-06 DIAGNOSIS — R531 Weakness: Secondary | ICD-10-CM | POA: Diagnosis not present

## 2016-04-09 DIAGNOSIS — Z96611 Presence of right artificial shoulder joint: Secondary | ICD-10-CM | POA: Diagnosis not present

## 2016-04-09 DIAGNOSIS — M25611 Stiffness of right shoulder, not elsewhere classified: Secondary | ICD-10-CM | POA: Diagnosis not present

## 2016-04-09 DIAGNOSIS — M25511 Pain in right shoulder: Secondary | ICD-10-CM | POA: Diagnosis not present

## 2016-04-09 DIAGNOSIS — R531 Weakness: Secondary | ICD-10-CM | POA: Diagnosis not present

## 2016-04-13 DIAGNOSIS — L988 Other specified disorders of the skin and subcutaneous tissue: Secondary | ICD-10-CM | POA: Diagnosis not present

## 2016-04-15 DIAGNOSIS — Z96611 Presence of right artificial shoulder joint: Secondary | ICD-10-CM | POA: Diagnosis not present

## 2016-04-15 DIAGNOSIS — M25611 Stiffness of right shoulder, not elsewhere classified: Secondary | ICD-10-CM | POA: Diagnosis not present

## 2016-04-15 DIAGNOSIS — M25511 Pain in right shoulder: Secondary | ICD-10-CM | POA: Diagnosis not present

## 2016-04-15 DIAGNOSIS — R531 Weakness: Secondary | ICD-10-CM | POA: Diagnosis not present

## 2016-04-17 DIAGNOSIS — M25611 Stiffness of right shoulder, not elsewhere classified: Secondary | ICD-10-CM | POA: Diagnosis not present

## 2016-04-17 DIAGNOSIS — R531 Weakness: Secondary | ICD-10-CM | POA: Diagnosis not present

## 2016-04-17 DIAGNOSIS — M25511 Pain in right shoulder: Secondary | ICD-10-CM | POA: Diagnosis not present

## 2016-04-17 DIAGNOSIS — Z96611 Presence of right artificial shoulder joint: Secondary | ICD-10-CM | POA: Diagnosis not present

## 2016-04-23 DIAGNOSIS — H401122 Primary open-angle glaucoma, left eye, moderate stage: Secondary | ICD-10-CM | POA: Diagnosis not present

## 2016-04-23 DIAGNOSIS — H04123 Dry eye syndrome of bilateral lacrimal glands: Secondary | ICD-10-CM | POA: Diagnosis not present

## 2016-04-23 DIAGNOSIS — H401111 Primary open-angle glaucoma, right eye, mild stage: Secondary | ICD-10-CM | POA: Diagnosis not present

## 2016-04-23 DIAGNOSIS — M25511 Pain in right shoulder: Secondary | ICD-10-CM | POA: Diagnosis not present

## 2016-04-23 DIAGNOSIS — Z96611 Presence of right artificial shoulder joint: Secondary | ICD-10-CM | POA: Diagnosis not present

## 2016-04-23 DIAGNOSIS — H40052 Ocular hypertension, left eye: Secondary | ICD-10-CM | POA: Diagnosis not present

## 2016-04-23 DIAGNOSIS — R531 Weakness: Secondary | ICD-10-CM | POA: Diagnosis not present

## 2016-04-23 DIAGNOSIS — M25611 Stiffness of right shoulder, not elsewhere classified: Secondary | ICD-10-CM | POA: Diagnosis not present

## 2016-04-27 DIAGNOSIS — M25511 Pain in right shoulder: Secondary | ICD-10-CM | POA: Diagnosis not present

## 2016-04-27 DIAGNOSIS — Z96611 Presence of right artificial shoulder joint: Secondary | ICD-10-CM | POA: Diagnosis not present

## 2016-04-27 DIAGNOSIS — R531 Weakness: Secondary | ICD-10-CM | POA: Diagnosis not present

## 2016-04-27 DIAGNOSIS — M25611 Stiffness of right shoulder, not elsewhere classified: Secondary | ICD-10-CM | POA: Diagnosis not present

## 2016-04-30 DIAGNOSIS — Z96611 Presence of right artificial shoulder joint: Secondary | ICD-10-CM | POA: Diagnosis not present

## 2016-04-30 DIAGNOSIS — M25611 Stiffness of right shoulder, not elsewhere classified: Secondary | ICD-10-CM | POA: Diagnosis not present

## 2016-04-30 DIAGNOSIS — R531 Weakness: Secondary | ICD-10-CM | POA: Diagnosis not present

## 2016-04-30 DIAGNOSIS — M25511 Pain in right shoulder: Secondary | ICD-10-CM | POA: Diagnosis not present

## 2016-05-06 DIAGNOSIS — Z96611 Presence of right artificial shoulder joint: Secondary | ICD-10-CM | POA: Diagnosis not present

## 2016-05-06 DIAGNOSIS — R531 Weakness: Secondary | ICD-10-CM | POA: Diagnosis not present

## 2016-05-06 DIAGNOSIS — M25611 Stiffness of right shoulder, not elsewhere classified: Secondary | ICD-10-CM | POA: Diagnosis not present

## 2016-05-06 DIAGNOSIS — M25511 Pain in right shoulder: Secondary | ICD-10-CM | POA: Diagnosis not present

## 2016-05-07 DIAGNOSIS — M7581 Other shoulder lesions, right shoulder: Secondary | ICD-10-CM | POA: Diagnosis not present

## 2016-05-11 DIAGNOSIS — C4442 Squamous cell carcinoma of skin of scalp and neck: Secondary | ICD-10-CM | POA: Diagnosis not present

## 2016-05-11 DIAGNOSIS — D485 Neoplasm of uncertain behavior of skin: Secondary | ICD-10-CM | POA: Diagnosis not present

## 2016-05-11 DIAGNOSIS — L988 Other specified disorders of the skin and subcutaneous tissue: Secondary | ICD-10-CM | POA: Diagnosis not present

## 2016-05-11 DIAGNOSIS — Z8589 Personal history of malignant neoplasm of other organs and systems: Secondary | ICD-10-CM | POA: Diagnosis not present

## 2016-05-13 DIAGNOSIS — M25511 Pain in right shoulder: Secondary | ICD-10-CM | POA: Diagnosis not present

## 2016-05-13 DIAGNOSIS — Z96611 Presence of right artificial shoulder joint: Secondary | ICD-10-CM | POA: Diagnosis not present

## 2016-05-13 DIAGNOSIS — M25611 Stiffness of right shoulder, not elsewhere classified: Secondary | ICD-10-CM | POA: Diagnosis not present

## 2016-05-13 DIAGNOSIS — R531 Weakness: Secondary | ICD-10-CM | POA: Diagnosis not present

## 2016-05-19 DIAGNOSIS — Z96611 Presence of right artificial shoulder joint: Secondary | ICD-10-CM | POA: Diagnosis not present

## 2016-05-19 DIAGNOSIS — M25511 Pain in right shoulder: Secondary | ICD-10-CM | POA: Diagnosis not present

## 2016-05-19 DIAGNOSIS — R531 Weakness: Secondary | ICD-10-CM | POA: Diagnosis not present

## 2016-05-19 DIAGNOSIS — M25611 Stiffness of right shoulder, not elsewhere classified: Secondary | ICD-10-CM | POA: Diagnosis not present

## 2016-05-22 DIAGNOSIS — Z86010 Personal history of colonic polyps: Secondary | ICD-10-CM | POA: Diagnosis not present

## 2016-05-22 DIAGNOSIS — K641 Second degree hemorrhoids: Secondary | ICD-10-CM | POA: Diagnosis not present

## 2016-05-30 DIAGNOSIS — E663 Overweight: Secondary | ICD-10-CM | POA: Diagnosis not present

## 2016-05-30 DIAGNOSIS — Z6829 Body mass index (BMI) 29.0-29.9, adult: Secondary | ICD-10-CM | POA: Diagnosis not present

## 2016-05-30 DIAGNOSIS — N390 Urinary tract infection, site not specified: Secondary | ICD-10-CM | POA: Diagnosis not present

## 2016-06-02 DIAGNOSIS — L821 Other seborrheic keratosis: Secondary | ICD-10-CM | POA: Diagnosis not present

## 2016-06-02 DIAGNOSIS — L578 Other skin changes due to chronic exposure to nonionizing radiation: Secondary | ICD-10-CM | POA: Diagnosis not present

## 2016-06-02 DIAGNOSIS — C4442 Squamous cell carcinoma of skin of scalp and neck: Secondary | ICD-10-CM | POA: Diagnosis not present

## 2016-06-02 DIAGNOSIS — L989 Disorder of the skin and subcutaneous tissue, unspecified: Secondary | ICD-10-CM | POA: Diagnosis not present

## 2016-06-02 DIAGNOSIS — L570 Actinic keratosis: Secondary | ICD-10-CM | POA: Diagnosis not present

## 2016-06-02 DIAGNOSIS — Z85828 Personal history of other malignant neoplasm of skin: Secondary | ICD-10-CM | POA: Diagnosis not present

## 2016-06-22 DIAGNOSIS — Z86010 Personal history of colonic polyps: Secondary | ICD-10-CM | POA: Diagnosis not present

## 2016-06-22 DIAGNOSIS — K5904 Chronic idiopathic constipation: Secondary | ICD-10-CM | POA: Diagnosis not present

## 2016-06-22 DIAGNOSIS — K641 Second degree hemorrhoids: Secondary | ICD-10-CM | POA: Diagnosis not present

## 2016-07-17 DIAGNOSIS — E785 Hyperlipidemia, unspecified: Secondary | ICD-10-CM | POA: Diagnosis not present

## 2016-07-17 DIAGNOSIS — Z0000 Encounter for general adult medical examination without abnormal findings: Secondary | ICD-10-CM | POA: Diagnosis not present

## 2016-07-17 DIAGNOSIS — G2581 Restless legs syndrome: Secondary | ICD-10-CM | POA: Diagnosis not present

## 2016-07-17 DIAGNOSIS — K08409 Partial loss of teeth, unspecified cause, unspecified class: Secondary | ICD-10-CM | POA: Diagnosis not present

## 2016-07-17 DIAGNOSIS — E7800 Pure hypercholesterolemia, unspecified: Secondary | ICD-10-CM | POA: Diagnosis not present

## 2016-07-17 DIAGNOSIS — C4442 Squamous cell carcinoma of skin of scalp and neck: Secondary | ICD-10-CM | POA: Diagnosis not present

## 2016-07-17 DIAGNOSIS — I10 Essential (primary) hypertension: Secondary | ICD-10-CM | POA: Diagnosis not present

## 2016-07-17 DIAGNOSIS — M13141 Monoarthritis, not elsewhere classified, right hand: Secondary | ICD-10-CM | POA: Diagnosis not present

## 2016-07-17 DIAGNOSIS — Z1389 Encounter for screening for other disorder: Secondary | ICD-10-CM | POA: Diagnosis not present

## 2016-07-17 DIAGNOSIS — G4700 Insomnia, unspecified: Secondary | ICD-10-CM | POA: Diagnosis not present

## 2016-07-17 DIAGNOSIS — Z6828 Body mass index (BMI) 28.0-28.9, adult: Secondary | ICD-10-CM | POA: Diagnosis not present

## 2016-07-17 DIAGNOSIS — Z96611 Presence of right artificial shoulder joint: Secondary | ICD-10-CM | POA: Diagnosis not present

## 2016-07-17 DIAGNOSIS — E663 Overweight: Secondary | ICD-10-CM | POA: Diagnosis not present

## 2016-07-17 DIAGNOSIS — H409 Unspecified glaucoma: Secondary | ICD-10-CM | POA: Diagnosis not present

## 2016-07-17 DIAGNOSIS — Z79899 Other long term (current) drug therapy: Secondary | ICD-10-CM | POA: Diagnosis not present

## 2016-07-17 DIAGNOSIS — R32 Unspecified urinary incontinence: Secondary | ICD-10-CM | POA: Diagnosis not present

## 2016-07-17 DIAGNOSIS — Z972 Presence of dental prosthetic device (complete) (partial): Secondary | ICD-10-CM | POA: Diagnosis not present

## 2016-07-17 DIAGNOSIS — Z9181 History of falling: Secondary | ICD-10-CM | POA: Diagnosis not present

## 2016-07-17 DIAGNOSIS — M47819 Spondylosis without myelopathy or radiculopathy, site unspecified: Secondary | ICD-10-CM | POA: Diagnosis not present

## 2016-07-17 DIAGNOSIS — Z7982 Long term (current) use of aspirin: Secondary | ICD-10-CM | POA: Diagnosis not present

## 2016-07-17 DIAGNOSIS — Z6829 Body mass index (BMI) 29.0-29.9, adult: Secondary | ICD-10-CM | POA: Diagnosis not present

## 2016-07-17 DIAGNOSIS — K219 Gastro-esophageal reflux disease without esophagitis: Secondary | ICD-10-CM | POA: Diagnosis not present

## 2016-08-11 DIAGNOSIS — L218 Other seborrheic dermatitis: Secondary | ICD-10-CM | POA: Diagnosis not present

## 2016-08-11 DIAGNOSIS — L570 Actinic keratosis: Secondary | ICD-10-CM | POA: Diagnosis not present

## 2016-08-11 DIAGNOSIS — Z85828 Personal history of other malignant neoplasm of skin: Secondary | ICD-10-CM | POA: Diagnosis not present

## 2016-08-11 DIAGNOSIS — L821 Other seborrheic keratosis: Secondary | ICD-10-CM | POA: Diagnosis not present

## 2016-09-22 DIAGNOSIS — L988 Other specified disorders of the skin and subcutaneous tissue: Secondary | ICD-10-CM | POA: Diagnosis not present

## 2016-09-30 DIAGNOSIS — H04123 Dry eye syndrome of bilateral lacrimal glands: Secondary | ICD-10-CM | POA: Diagnosis not present

## 2016-09-30 DIAGNOSIS — H401111 Primary open-angle glaucoma, right eye, mild stage: Secondary | ICD-10-CM | POA: Diagnosis not present

## 2016-09-30 DIAGNOSIS — H401122 Primary open-angle glaucoma, left eye, moderate stage: Secondary | ICD-10-CM | POA: Diagnosis not present

## 2016-09-30 DIAGNOSIS — H40053 Ocular hypertension, bilateral: Secondary | ICD-10-CM | POA: Diagnosis not present

## 2016-10-13 DIAGNOSIS — L988 Other specified disorders of the skin and subcutaneous tissue: Secondary | ICD-10-CM | POA: Diagnosis not present

## 2016-10-13 DIAGNOSIS — Z79899 Other long term (current) drug therapy: Secondary | ICD-10-CM | POA: Diagnosis not present

## 2016-10-21 DIAGNOSIS — K649 Unspecified hemorrhoids: Secondary | ICD-10-CM | POA: Diagnosis not present

## 2016-10-21 DIAGNOSIS — Z86010 Personal history of colonic polyps: Secondary | ICD-10-CM | POA: Diagnosis not present

## 2016-11-02 DIAGNOSIS — Z79899 Other long term (current) drug therapy: Secondary | ICD-10-CM | POA: Diagnosis not present

## 2016-11-02 DIAGNOSIS — L989 Disorder of the skin and subcutaneous tissue, unspecified: Secondary | ICD-10-CM | POA: Diagnosis not present

## 2016-11-12 DIAGNOSIS — Z9181 History of falling: Secondary | ICD-10-CM | POA: Diagnosis not present

## 2016-11-12 DIAGNOSIS — Z0000 Encounter for general adult medical examination without abnormal findings: Secondary | ICD-10-CM | POA: Diagnosis not present

## 2016-11-12 DIAGNOSIS — Z1389 Encounter for screening for other disorder: Secondary | ICD-10-CM | POA: Diagnosis not present

## 2016-11-12 DIAGNOSIS — Z136 Encounter for screening for cardiovascular disorders: Secondary | ICD-10-CM | POA: Diagnosis not present

## 2016-11-12 DIAGNOSIS — E785 Hyperlipidemia, unspecified: Secondary | ICD-10-CM | POA: Diagnosis not present

## 2016-11-20 DIAGNOSIS — Z79899 Other long term (current) drug therapy: Secondary | ICD-10-CM | POA: Diagnosis not present

## 2016-11-25 ENCOUNTER — Encounter (HOSPITAL_COMMUNITY): Payer: Self-pay | Admitting: Physician Assistant

## 2016-11-25 ENCOUNTER — Ambulatory Visit (HOSPITAL_COMMUNITY)
Admission: EM | Admit: 2016-11-25 | Discharge: 2016-11-25 | Disposition: A | Discharge status: Home / Self Care | Payer: Medicare HMO | Attending: Family Medicine | Admitting: Family Medicine

## 2016-11-25 DIAGNOSIS — M545 Low back pain: Secondary | ICD-10-CM

## 2016-11-25 DIAGNOSIS — R1032 Left lower quadrant pain: Secondary | ICD-10-CM

## 2016-11-25 DIAGNOSIS — R7989 Other specified abnormal findings of blood chemistry: Secondary | ICD-10-CM | POA: Diagnosis not present

## 2016-11-25 LAB — POCT URINALYSIS DIP (DEVICE)
BILIRUBIN URINE: NEGATIVE
Glucose, UA: NEGATIVE mg/dL
KETONES UR: NEGATIVE mg/dL
LEUKOCYTES UA: NEGATIVE
NITRITE: NEGATIVE
PH: 7 (ref 5.0–8.0)
Protein, ur: NEGATIVE mg/dL
Specific Gravity, Urine: 1.01 (ref 1.005–1.030)
Urobilinogen, UA: 0.2 mg/dL (ref 0.0–1.0)

## 2016-11-25 NOTE — ED Provider Notes (Signed)
[place]    CSN: [id number] Arrival date & time: 11/25/16  1003     History   Chief Complaint Chief Complaint  Patient presents with  . Back Pain    HPI Crystal Vazquez is a 83 y.o. female.   83 year old female with history of diverticulosis, hypertension, GERD, skin cancer, comes in for evaluation of kidney problems. She states that due to the new medication, dapsone, that she started, she needs to get blood work each week, and was told her kidney function has decreased. Patient states she has noticed some foul smelling urine since she started dapsone about a month ago.  Patient states she usually drinks coffee in the morning and tea in the afternoon. She drinks water to take her medications. She has some lower back pain, which she states is more on her hips. Pain is intermittent, and only present during movement. Denies frequency, dysuria, hematuria. Denies fever, chills, night sweats. States her weakness is normal for her at her age, and denies increased weakness. Denies dizziness, chest pain, shortness of breath. Denies abdominal pain, nausea, vomiting, diarrhea. She has a history of constipation, which is being treated by her PCP, and last BM yesterday that is without straining.       Past Medical History:  Diagnosis Date  . Arthritis   . Cancer (HCC)    SCC Scalp  . Diverticulosis of colon (without mention of hemorrhage) 2003   colonoscopy 2003  . Gastritis 2008   endoscopy 2008  . GERD (gastroesophageal reflux disease)   . Glaucoma    left eye  . Hypertension   . Insomnia   . Melanoma ([place]) 1996   on back, excised with no recurrence  . Neuropathy    feet and legs  . Restless legs syndrome (RLS)   . Urinary incontinence   . Wears glasses     Patient Active Problem List   Diagnosis Date Noted  . S/P shoulder replacement, right 02/14/2016    Past Surgical History:  Procedure Laterality Date  . ABDOMINAL HYSTERECTOMY    . COLONOSCOPY    .  ESOPHAGOGASTRODUODENOSCOPY    . REVERSE SHOULDER ARTHROPLASTY Right 02/14/2016   Procedure: RIGHT REVERSE SHOULDER ARTHROPLASTY;  Surgeon: Netta Cedars, MD;  Location: [place];  Service: Orthopedics;  Laterality: Right;    OB History    No data available       Home Medications    Prior to Admission medications   Medication Sig Start Date End Date Taking? Authorizing Provider  acetaminophen (TYLENOL) 500 MG tablet Take 500 mg by mouth every 6 (six) hours as needed for mild pain.    [provider]  aspirin 81 MG tablet Take 81 mg by mouth daily.      [provider]  atorvastatin (LIPITOR) 40 MG tablet Take 40 mg by mouth daily at 6 PM.     [provider]  clobetasol ointment (TEMOVATE) 3.29 % Apply 1 application topically daily as needed (ITCHING).  12/30/15   [provider]  gabapentin (NEURONTIN) 300 MG capsule Take 300 mg by mouth 2 (two) times daily.      [provider]  HYDROcodone-acetaminophen (NORCO) 5-325 MG tablet Take 1 tablet by mouth every 4 (four) hours as needed for moderate pain. 02/14/16   Netta Cedars, MD  latanoprost (XALATAN) 0.005 % ophthalmic solution Place 1 drop into the left eye at bedtime.      [provider]  losartan (COZAAR) 25 MG tablet Take  50 mg by mouth daily.  12/27/15   [provider]  omeprazole (PRILOSEC) 40 MG capsule Take 40 mg by mouth daily.    [provider]  traZODone (DESYREL) 50 MG tablet Take 1 tablet by mouth at bedtime.    [provider]  triamcinolone ointment (KENALOG) 0.1 % Apply 1 application topically 2 (two) times daily. 12/30/15   [provider]  zolpidem (AMBIEN) 5 MG tablet Take 5 mg by mouth at bedtime.  11/23/15   [provider]    Family History Family History  Problem Relation Age of Onset  . Alzheimer's disease Sister   . Breast cancer Unknown        sisters, aunt  . Aneurysm Mother        AAA  . Heart disease Mother         mother, aunts, uncles had"heart problems" unknown to pt  . Hypertension Unknown        multiple family members  . Diabetes type II Brother     Social History Social History  Substance Use Topics  . Smoking status: Never Smoker  . Smokeless tobacco: Never Used  . Alcohol use No     Allergies   Monosodium glutamate; Penicillins; Sulfa drugs cross reactors; Aleve [naproxen sodium]; Codeine; and Red dye   Review of Systems Review of Systems  Constitutional: Negative for chills, diaphoresis, fatigue and fever.  Respiratory: Negative for shortness of breath and wheezing.   Cardiovascular: Negative for chest pain, palpitations and leg swelling.  Gastrointestinal: Negative for abdominal pain, blood in stool, constipation, diarrhea, nausea and vomiting.  Genitourinary: Negative for dysuria, flank pain, frequency, hematuria and urgency.  Musculoskeletal: Positive for back pain.  Neurological: Negative for dizziness, syncope, weakness and light-headedness.     Physical Exam Triage Vital Signs ED Triage Vitals [11/25/16 1035]  Enc Vitals Group     BP (!) 154/73     Pulse Rate 74     Resp 18     Temp 97.8 F (36.6 C)     Temp Source Oral     SpO2 95 %     Weight      Height      Head Circumference      Peak Flow      Pain Score 1     Pain Loc      Pain Edu?      Excl. in [place]?    No data found.   Updated Vital Signs BP (!) 154/73 (BP Location: Right Arm)   Pulse 74   Temp 97.8 F (36.6 C) (Oral)   Resp 18   SpO2 95%     Physical Exam  Constitutional: She is oriented to person, place, and time. She appears well-developed and well-nourished. No distress.  HENT:  Head: Normocephalic and atraumatic.  Eyes: Pupils are equal, round, and reactive to light. Conjunctivae are normal.  Cardiovascular: Normal rate, regular rhythm and normal heart sounds.  Exam reveals no gallop and no friction rub.   No murmur heard. Pulmonary/Chest: Effort normal and breath sounds  normal. She has no wheezes. She has no rales.  Abdominal: Soft. Bowel sounds are normal. She exhibits no mass. There is tenderness (Mild LLQ tenderness). There is no rebound, no guarding and no CVA tenderness.  Musculoskeletal:  No tenderness on palpation of spinous processes and bilateral back. No tenderness on palpation of hips. Full range of motion of back.  Neurological: She is alert and oriented to person, place,  and time.  Skin: Skin is warm and dry.  Psychiatric: She has a normal mood and affect. Her behavior is normal. Judgment normal.     UC Treatments / Results  Labs (all labs ordered are listed, but only abnormal results are displayed) Labs Reviewed  POCT URINALYSIS DIP (DEVICE) - Abnormal; Notable for the following:       Result Value   Hgb urine dipstick TRACE (*)    All other components within normal limits    EKG  EKG Interpretation None       Radiology No results found.  Procedures Procedures (including critical care time)  Medications Ordered in UC Medications - No data to display   Initial Impression / Assessment and Plan / UC Course  I have reviewed the triage vital signs and the nursing notes.  Pertinent labs & imaging results that were available during my care of the patient were reviewed by me and considered in my medical decision making (see chart for details).     Urine negative for UTI. Patient states low back pain more consistent with hip pain, it is only present during movement, discussed possibility of osteoarthritis. Mild LLQ tenderness only on exam, patient without pain at rest, patient to monitor. Given patient without good water intake, discussed possibility of dehydration as cause of recent change in creatinine. Patient to push fluids, follow up with PCP as scheduled for recheck of renal function. Return precautions given.   Case discussed with Dr Mannie Stabile, who agrees to plan.   Final Clinical Impressions(s) / UC Diagnoses   Final  diagnoses:  Elevated serum creatinine    New Prescriptions Discharge Medication List as of 11/25/2016 11:06 AM       Ok Edwards, PA-C 11/25/16 1346

## 2016-11-25 NOTE — ED Triage Notes (Signed)
The patient presented to the [facility] with a complaint of lower back pain. The patient stated that she was at [facility] dermatology and theuy told her she had a urine specimen that was positive for a UTI and needed treatment.

## 2016-11-25 NOTE — Discharge Instructions (Signed)
Your urine was negative for a UTI. Keep hydrated, you urine should be clear to pale yellow in color. Follow up with your PCP as scheduled to recheck kidney function. Monitor for any worsening of symptoms, fever, abdominal pain, nausea/vomiting, weakness, dizziness, chest pain, shortness of breath, urinary symptoms such as frequency, burning, blood in urine, follow up for reevaluation.

## 2016-11-30 DIAGNOSIS — I10 Essential (primary) hypertension: Secondary | ICD-10-CM | POA: Diagnosis not present

## 2016-11-30 DIAGNOSIS — R600 Localized edema: Secondary | ICD-10-CM | POA: Diagnosis not present

## 2016-11-30 DIAGNOSIS — N179 Acute kidney failure, unspecified: Secondary | ICD-10-CM | POA: Diagnosis not present

## 2016-11-30 DIAGNOSIS — Z6829 Body mass index (BMI) 29.0-29.9, adult: Secondary | ICD-10-CM | POA: Diagnosis not present

## 2016-12-04 ENCOUNTER — Encounter (HOSPITAL_COMMUNITY): Payer: Self-pay | Admitting: Emergency Medicine

## 2016-12-04 DIAGNOSIS — R51 Headache: Secondary | ICD-10-CM | POA: Diagnosis not present

## 2016-12-04 DIAGNOSIS — Z880 Allergy status to penicillin: Secondary | ICD-10-CM | POA: Diagnosis not present

## 2016-12-04 DIAGNOSIS — I10 Essential (primary) hypertension: Secondary | ICD-10-CM | POA: Insufficient documentation

## 2016-12-04 DIAGNOSIS — R030 Elevated blood-pressure reading, without diagnosis of hypertension: Secondary | ICD-10-CM | POA: Insufficient documentation

## 2016-12-04 DIAGNOSIS — Z7982 Long term (current) use of aspirin: Secondary | ICD-10-CM | POA: Insufficient documentation

## 2016-12-04 DIAGNOSIS — Z79899 Other long term (current) drug therapy: Secondary | ICD-10-CM | POA: Diagnosis not present

## 2016-12-04 DIAGNOSIS — C4442 Squamous cell carcinoma of skin of scalp and neck: Secondary | ICD-10-CM | POA: Insufficient documentation

## 2016-12-04 DIAGNOSIS — Z885 Allergy status to narcotic agent status: Secondary | ICD-10-CM | POA: Insufficient documentation

## 2016-12-04 LAB — CBC
HEMATOCRIT: 33.4 % — AB (ref 36.0–46.0)
Hemoglobin: 11 g/dL — ABNORMAL LOW (ref 12.0–15.0)
MCH: 34.5 pg — AB (ref 26.0–34.0)
MCHC: 32.9 g/dL (ref 30.0–36.0)
MCV: 104.7 fL — AB (ref 78.0–100.0)
Platelets: 244 10*3/uL (ref 150–400)
RBC: 3.19 MIL/uL — ABNORMAL LOW (ref 3.87–5.11)
RDW: 13.6 % (ref 11.5–15.5)
WBC: 6.2 10*3/uL (ref 4.0–10.5)

## 2016-12-04 LAB — I-STAT TROPONIN, ED: Troponin i, poc: 0.01 ng/mL (ref 0.00–0.08)

## 2016-12-04 NOTE — ED Triage Notes (Signed)
Pt reports feeling like a "tight band" was around her head around 8:30pm.  States she took her BP at home and it was 195/70.

## 2016-12-05 ENCOUNTER — Encounter (HOSPITAL_COMMUNITY): Payer: Self-pay | Admitting: Emergency Medicine

## 2016-12-05 ENCOUNTER — Emergency Department (HOSPITAL_COMMUNITY): Payer: Medicare HMO

## 2016-12-05 ENCOUNTER — Emergency Department (HOSPITAL_COMMUNITY)
Admission: EM | Admit: 2016-12-05 | Discharge: 2016-12-05 | Disposition: A | Discharge status: Home / Self Care | Payer: Medicare HMO | Attending: Emergency Medicine | Admitting: Emergency Medicine

## 2016-12-05 DIAGNOSIS — R030 Elevated blood-pressure reading, without diagnosis of hypertension: Secondary | ICD-10-CM | POA: Diagnosis not present

## 2016-12-05 DIAGNOSIS — Z7982 Long term (current) use of aspirin: Secondary | ICD-10-CM | POA: Diagnosis not present

## 2016-12-05 DIAGNOSIS — R51 Headache: Secondary | ICD-10-CM | POA: Diagnosis not present

## 2016-12-05 DIAGNOSIS — C4442 Squamous cell carcinoma of skin of scalp and neck: Secondary | ICD-10-CM | POA: Diagnosis not present

## 2016-12-05 DIAGNOSIS — R519 Headache, unspecified: Secondary | ICD-10-CM

## 2016-12-05 DIAGNOSIS — Z880 Allergy status to penicillin: Secondary | ICD-10-CM | POA: Diagnosis not present

## 2016-12-05 DIAGNOSIS — I10 Essential (primary) hypertension: Secondary | ICD-10-CM | POA: Diagnosis not present

## 2016-12-05 DIAGNOSIS — Z885 Allergy status to narcotic agent status: Secondary | ICD-10-CM | POA: Diagnosis not present

## 2016-12-05 DIAGNOSIS — Z79899 Other long term (current) drug therapy: Secondary | ICD-10-CM | POA: Diagnosis not present

## 2016-12-05 LAB — BASIC METABOLIC PANEL
Anion gap: 8 (ref 5–15)
BUN: 14 mg/dL (ref 6–20)
CALCIUM: 9.1 mg/dL (ref 8.9–10.3)
CHLORIDE: 102 mmol/L (ref 101–111)
CO2: 24 mmol/L (ref 22–32)
CREATININE: 1.11 mg/dL — AB (ref 0.44–1.00)
GFR calc Af Amer: 50 mL/min — ABNORMAL LOW (ref >60)
GFR calc non Af Amer: 43 mL/min — ABNORMAL LOW (ref >60)
Glucose, Bld: 99 mg/dL (ref 65–99)
Potassium: 3.8 mmol/L (ref 3.5–5.1)
Sodium: 134 mmol/L — ABNORMAL LOW (ref 135–145)

## 2016-12-05 NOTE — ED Provider Notes (Signed)
[place] DEPT Provider Note   CSN: [id number] Arrival date & time: 12/04/16  2254     History   Chief Complaint Chief Complaint  Patient presents with  . Headache  . Hypertension    HPI Crystal Vazquez is a 83 y.o. female.  The history is provided by the patient and medical records.    83 year old female with history of arthritis, doubt cancer, GERD, glaucoma, hypertension, melanoma, restless leg syndrome, presenting to the ED with headache. States she noticed this around 8:30- 9pm this evening.  States it feels like a band applying pressure around her head.  Denies pulsating/throbbing sensation.  No dizziness, blurred vision, confusion, neck pain, fever, photophobia, phonophobia, or aura associated.  She denies any focal numbness or weakness of her arms or legs. No difficulty walking. She has no history of migraine headaches and states that is not common for her to get headaches. She is on daily aspirin, no other anticoagulation. Reports when she noticed the headache today she did check her blood pressure and noticed that it was elevated.  She took an extra dose of her losartan at that time.  States she has been compliant with her medications.  Past Medical History:  Diagnosis Date  . Arthritis   . Cancer (HCC)    SCC Scalp  . Diverticulosis of colon (without mention of hemorrhage) 2003   colonoscopy 2003  . Gastritis 2008   endoscopy 2008  . GERD (gastroesophageal reflux disease)   . Glaucoma    left eye  . Hypertension   . Insomnia   . Melanoma ([place]) 1996   on back, excised with no recurrence  . Neuropathy    feet and legs  . Restless legs syndrome (RLS)   . Urinary incontinence   . Wears glasses     Patient Active Problem List   Diagnosis Date Noted  . S/P shoulder replacement, right 02/14/2016    Past Surgical History:  Procedure Laterality Date  . ABDOMINAL HYSTERECTOMY    . COLONOSCOPY    . ESOPHAGOGASTRODUODENOSCOPY    . REVERSE SHOULDER  ARTHROPLASTY Right 02/14/2016   Procedure: RIGHT REVERSE SHOULDER ARTHROPLASTY;  Surgeon: Netta Cedars, MD;  Location: [place];  Service: Orthopedics;  Laterality: Right;    OB History    No data available       Home Medications    Prior to Admission medications   Medication Sig Start Date End Date Taking? Authorizing Provider  aspirin 81 MG tablet Take 81 mg by mouth daily.     Yes [provider]  gabapentin (NEURONTIN) 300 MG capsule Take 300 mg by mouth 2 (two) times daily.     Yes [provider]  latanoprost (XALATAN) 0.005 % ophthalmic solution Place 1 drop into the left eye at bedtime.     Yes [provider]  losartan (COZAAR) 25 MG tablet Take 50 mg by mouth daily.  12/27/15  Yes [provider]  omeprazole (PRILOSEC) 40 MG capsule Take 40 mg by mouth daily.   Yes [provider]  traZODone (DESYREL) 50 MG tablet Take 1 tablet by mouth at bedtime.   Yes [provider]  zolpidem (AMBIEN) 5 MG tablet Take 5 mg by mouth at bedtime.  11/23/15  Yes [provider]  acetaminophen (TYLENOL) 500 MG tablet Take 500 mg by mouth every 6 (six) hours as needed for mild pain.    [provider]  atorvastatin (LIPITOR) 40 MG tablet Take 40 mg by mouth daily  at 6 PM.     [provider]  clobetasol ointment (TEMOVATE) 0.17 % Apply 1 application topically daily as needed (ITCHING).  12/30/15   [provider]  HYDROcodone-acetaminophen (NORCO) 5-325 MG tablet Take 1 tablet by mouth every 4 (four) hours as needed for moderate pain. 02/14/16   Netta Cedars, MD  triamcinolone ointment (KENALOG) 0.1 % Apply 1 application topically 2 (two) times daily. 12/30/15   [provider]    Family History Family History  Problem Relation Age of Onset  . Alzheimer's disease Sister   . Breast cancer Unknown        sisters, aunt  . Aneurysm Mother        AAA  . Heart disease Mother        mother, aunts, uncles  had"heart problems" unknown to pt  . Hypertension Unknown        multiple family members  . Diabetes type II Brother     Social History Social History  Substance Use Topics  . Smoking status: Never Smoker  . Smokeless tobacco: Never Used  . Alcohol use No     Allergies   Monosodium glutamate; Penicillins; Sulfa drugs cross reactors; Aleve [naproxen sodium]; Codeine; and Red dye   Review of Systems Review of Systems  Neurological: Positive for headaches.  All other systems reviewed and are negative.    Physical Exam Updated Vital Signs BP (!) 225/86 (BP Location: Right Arm)   Pulse 78   Temp 97.8 F (36.6 C) (Oral)   Resp 17   Ht 5\' 4"  (1.626 m)   Wt 73.5 kg (162 lb)   SpO2 93%   BMI 27.81 kg/m   Physical Exam  Constitutional: She is oriented to person, place, and time. She appears well-developed and well-nourished. No distress.  HENT:  Head: Normocephalic and atraumatic.  Right Ear: External ear normal.  Left Ear: External ear normal.  Mouth/Throat: Oropharynx is clear and moist.  Eyes: Pupils are equal, round, and reactive to light. Conjunctivae and EOM are normal.  Neck: Normal range of motion and full passive range of motion without pain. Neck supple. No neck rigidity.  No rigidity, no meningismus  Cardiovascular: Normal rate, regular rhythm and normal heart sounds.   No murmur heard. Pulmonary/Chest: Effort normal and breath sounds normal. No respiratory distress. She has no wheezes. She has no rhonchi.  Abdominal: Soft. Bowel sounds are normal. There is no tenderness. There is no rebound and no guarding.  Musculoskeletal: Normal range of motion. She exhibits no edema.  Neurological: She is alert and oriented to person, place, and time. She has normal strength. She displays no tremor. No cranial nerve deficit or sensory deficit. She displays no seizure activity.  AAOx3, answering questions and following commands appropriately; equal strength UE and LE  bilaterally; CN grossly intact; moves all extremities appropriately without ataxia; no focal neuro deficits or facial asymmetry appreciated  Skin: Skin is warm and dry. No rash noted. She is not diaphoretic.  Psychiatric: She has a normal mood and affect. Her behavior is normal. Thought content normal.  Nursing note and vitals reviewed.    ED Treatments / Results  Labs (all labs ordered are listed, but only abnormal results are displayed) Labs Reviewed  BASIC METABOLIC PANEL - Abnormal; Notable for the following:       Result Value   Sodium 134 (*)    Creatinine, Ser 1.11 (*)    GFR calc non Af Amer 43 (*)  GFR calc Af Amer 50 (*)    All other components within normal limits  CBC - Abnormal; Notable for the following:    RBC 3.19 (*)    Hemoglobin 11.0 (*)    HCT 33.4 (*)    MCV 104.7 (*)    MCH 34.5 (*)    All other components within normal limits  I-STAT TROPONIN, ED    EKG  EKG Interpretation  Date/Time:  Friday December 04 2016 23:15:21 EDT Ventricular Rate:  60 PR Interval:  164 QRS Duration: 76 QT Interval:  420 QTC Calculation: 420 R Axis:   11 Text Interpretation:  Normal sinus rhythm Normal ECG No old tracing to compare Confirmed by Ward, Cyril Mourning [phone]) on 12/05/2016 2:21:49 AM       Radiology Ct Head Wo Contrast  Result Date: 12/05/2016 CLINICAL DATA:  Headache with elevated blood pressure EXAM: CT HEAD WITHOUT CONTRAST TECHNIQUE: Contiguous axial images were obtained from the base of the skull through the vertex without intravenous contrast. COMPARISON:  None. FINDINGS: Brain: No acute territorial infarction, hemorrhage, or intracranial mass is seen. Mild atrophy. Normal ventricle size. Vascular: No hyperdense vessels.  Carotid artery calcifications. Skull: No fracture or suspicious bone lesion. Sinuses/Orbits: Mild mucosal thickening in the ethmoid sinuses. No acute orbital abnormality. Other: None IMPRESSION: No CT evidence for acute intracranial  abnormality. Electronically Signed   By: Donavan Foil M.D.   On: 12/05/2016 02:48    Procedures Procedures (including critical care time)  Medications Ordered in ED Medications - No data to display   Initial Impression / Assessment and Plan / ED Course  I have reviewed the triage vital signs and the nursing notes.  Pertinent labs & imaging results that were available during my care of the patient were reviewed by me and considered in my medical decision making (see chart for details).  83 year old female here with headache and elevated blood pressure. States she noticed this around 8:30 PM this evening. States headache feels like a "band" around her entire head. She has no other associated symptoms with this. She is awake, alert, appropriately oriented here. Her neurologic exam is nonfocal. She has no signs or symptoms concerning for meningitis. Initial blood pressure was elevated, however this is normalized without acute intervention here in the ED. Her EKG and screening lab work is overall reassuring. Given her age, CT of her head was obtained which is negative. Patient feeling better as her blood pressure has decreased. She remains neurologically intact. She is stable for discharge. We'll have her continue to monitor her blood pressure closely at home. Encouraged follow-up with PCP.  Discussed plan with patient, she acknowledged understanding and agreed with plan of care.  Return precautions given for new or worsening symptoms.  Final Clinical Impressions(s) / ED Diagnoses   Final diagnoses:  Nonintractable headache, unspecified chronicity pattern, unspecified headache type  Elevated blood pressure reading    New Prescriptions New Prescriptions   No medications on file     Larene Pickett, PA-C 12/05/16 [place], Delice Bison, DO 12/05/16 [phone]

## 2016-12-05 NOTE — Discharge Instructions (Signed)
Try to keep and eye on your blood pressure at home. Call your doctor for follow-up, medications may need to be adjusted if you continue getting elevated readings at home. Return here for any new/worsening symptoms.

## 2016-12-07 DIAGNOSIS — Z79899 Other long term (current) drug therapy: Secondary | ICD-10-CM | POA: Diagnosis not present

## 2016-12-07 DIAGNOSIS — L989 Disorder of the skin and subcutaneous tissue, unspecified: Secondary | ICD-10-CM | POA: Diagnosis not present

## 2016-12-07 DIAGNOSIS — L988 Other specified disorders of the skin and subcutaneous tissue: Secondary | ICD-10-CM | POA: Diagnosis not present

## 2016-12-28 DIAGNOSIS — H40053 Ocular hypertension, bilateral: Secondary | ICD-10-CM | POA: Diagnosis not present

## 2016-12-28 DIAGNOSIS — H401122 Primary open-angle glaucoma, left eye, moderate stage: Secondary | ICD-10-CM | POA: Diagnosis not present

## 2016-12-28 DIAGNOSIS — H04123 Dry eye syndrome of bilateral lacrimal glands: Secondary | ICD-10-CM | POA: Diagnosis not present

## 2016-12-28 DIAGNOSIS — H401111 Primary open-angle glaucoma, right eye, mild stage: Secondary | ICD-10-CM | POA: Diagnosis not present

## 2017-01-07 DIAGNOSIS — Z23 Encounter for immunization: Secondary | ICD-10-CM | POA: Diagnosis not present

## 2017-01-18 DIAGNOSIS — L988 Other specified disorders of the skin and subcutaneous tissue: Secondary | ICD-10-CM | POA: Diagnosis not present

## 2017-01-18 DIAGNOSIS — L989 Disorder of the skin and subcutaneous tissue, unspecified: Secondary | ICD-10-CM | POA: Diagnosis not present

## 2017-02-02 DIAGNOSIS — G2581 Restless legs syndrome: Secondary | ICD-10-CM | POA: Diagnosis not present

## 2017-02-02 DIAGNOSIS — I10 Essential (primary) hypertension: Secondary | ICD-10-CM | POA: Diagnosis not present

## 2017-02-02 DIAGNOSIS — Z6829 Body mass index (BMI) 29.0-29.9, adult: Secondary | ICD-10-CM | POA: Diagnosis not present

## 2017-02-02 DIAGNOSIS — G629 Polyneuropathy, unspecified: Secondary | ICD-10-CM | POA: Diagnosis not present

## 2017-02-02 DIAGNOSIS — G4700 Insomnia, unspecified: Secondary | ICD-10-CM | POA: Diagnosis not present

## 2017-02-02 DIAGNOSIS — M545 Low back pain: Secondary | ICD-10-CM | POA: Diagnosis not present

## 2017-02-02 DIAGNOSIS — K219 Gastro-esophageal reflux disease without esophagitis: Secondary | ICD-10-CM | POA: Diagnosis not present

## 2017-02-02 DIAGNOSIS — E7800 Pure hypercholesterolemia, unspecified: Secondary | ICD-10-CM | POA: Diagnosis not present

## 2017-03-01 DIAGNOSIS — L989 Disorder of the skin and subcutaneous tissue, unspecified: Secondary | ICD-10-CM | POA: Diagnosis not present

## 2017-03-25 DIAGNOSIS — Z6829 Body mass index (BMI) 29.0-29.9, adult: Secondary | ICD-10-CM | POA: Diagnosis not present

## 2017-03-25 DIAGNOSIS — N390 Urinary tract infection, site not specified: Secondary | ICD-10-CM | POA: Diagnosis not present

## 2017-04-12 DIAGNOSIS — L989 Disorder of the skin and subcutaneous tissue, unspecified: Secondary | ICD-10-CM | POA: Diagnosis not present

## 2017-04-12 DIAGNOSIS — Z79899 Other long term (current) drug therapy: Secondary | ICD-10-CM | POA: Diagnosis not present

## 2017-05-04 DIAGNOSIS — H401111 Primary open-angle glaucoma, right eye, mild stage: Secondary | ICD-10-CM | POA: Diagnosis not present

## 2017-05-04 DIAGNOSIS — H401122 Primary open-angle glaucoma, left eye, moderate stage: Secondary | ICD-10-CM | POA: Diagnosis not present

## 2017-05-04 DIAGNOSIS — H04123 Dry eye syndrome of bilateral lacrimal glands: Secondary | ICD-10-CM | POA: Diagnosis not present

## 2017-05-04 DIAGNOSIS — H40052 Ocular hypertension, left eye: Secondary | ICD-10-CM | POA: Diagnosis not present

## 2017-05-13 DIAGNOSIS — H401122 Primary open-angle glaucoma, left eye, moderate stage: Secondary | ICD-10-CM | POA: Diagnosis not present

## 2017-05-24 ENCOUNTER — Ambulatory Visit: Payer: Self-pay | Admitting: Podiatry

## 2017-05-24 DIAGNOSIS — L308 Other specified dermatitis: Secondary | ICD-10-CM | POA: Diagnosis not present

## 2017-05-24 DIAGNOSIS — Z79899 Other long term (current) drug therapy: Secondary | ICD-10-CM | POA: Diagnosis not present

## 2017-05-24 DIAGNOSIS — L989 Disorder of the skin and subcutaneous tissue, unspecified: Secondary | ICD-10-CM | POA: Diagnosis not present

## 2017-06-02 ENCOUNTER — Ambulatory Visit (INDEPENDENT_AMBULATORY_CARE_PROVIDER_SITE_OTHER): Payer: Medicare HMO

## 2017-06-02 ENCOUNTER — Encounter: Payer: Self-pay | Admitting: Podiatry

## 2017-06-02 ENCOUNTER — Other Ambulatory Visit: Payer: Self-pay | Admitting: Podiatry

## 2017-06-02 ENCOUNTER — Ambulatory Visit: Payer: Medicare HMO | Admitting: Podiatry

## 2017-06-02 VITALS — BP 145/69 | HR 65

## 2017-06-02 DIAGNOSIS — B351 Tinea unguium: Secondary | ICD-10-CM

## 2017-06-02 DIAGNOSIS — M79676 Pain in unspecified toe(s): Secondary | ICD-10-CM

## 2017-06-02 DIAGNOSIS — M722 Plantar fascial fibromatosis: Secondary | ICD-10-CM | POA: Diagnosis not present

## 2017-06-02 DIAGNOSIS — M79609 Pain in unspecified limb: Secondary | ICD-10-CM

## 2017-06-02 MED ORDER — TRIAMCINOLONE ACETONIDE 10 MG/ML IJ SUSP
10.0000 mg | Freq: Once | INTRAMUSCULAR | Status: AC
  Administered 2017-06-02: 10 mg

## 2017-06-02 NOTE — Progress Notes (Signed)
Subjective:   Patient ID: Crystal Vazquez, female   DOB: 84 y.o.   MRN: [id number]   HPI Patient presents with nail disease 1-5 both feet that are thick and she cannot cut herself and get sore and also is complaining about heel pain bottom of the left heel states sore and makes it hard for her to walk comfortably   Review of Systems  All other systems reviewed and are negative.       Objective:  Physical Exam  Constitutional: She appears well-developed and well-nourished.  Cardiovascular: Intact distal pulses.  Pulmonary/Chest: Effort normal.  Musculoskeletal: Normal range of motion.  Neurological: She is alert.  Skin: Skin is warm.  Nursing note and vitals reviewed.   Neurovascular status intact muscle strength adequate range of motion within normal limits with patient found to have exquisite discomfort plantar aspect left heel and nail disease 1-5 both feet with thick yellow brittle nails that are painful when palpated.  Patient was noted to have good digital perfusion and well oriented x3 and patient states that she does not smoke and would like to be more active     Assessment:  Mycotic nail infection with pain 1-5 both feet along with plantar fasciitis left     Plan:  H&P x-rays left reviewed and today I injected the left plantar fascia 3 mg Kenalog 5 mg Xylocaine and debrided nailbeds 1-5 both feet with no iatrogenic bleeding noted  X-ray indicates plantar spur but no indications of stress fracture or advanced arthritis

## 2017-06-15 DIAGNOSIS — G4700 Insomnia, unspecified: Secondary | ICD-10-CM | POA: Diagnosis not present

## 2017-06-15 DIAGNOSIS — Z79899 Other long term (current) drug therapy: Secondary | ICD-10-CM | POA: Diagnosis not present

## 2017-06-15 DIAGNOSIS — E785 Hyperlipidemia, unspecified: Secondary | ICD-10-CM | POA: Diagnosis not present

## 2017-06-15 DIAGNOSIS — I10 Essential (primary) hypertension: Secondary | ICD-10-CM | POA: Diagnosis not present

## 2017-06-15 DIAGNOSIS — L309 Dermatitis, unspecified: Secondary | ICD-10-CM | POA: Diagnosis not present

## 2017-06-15 DIAGNOSIS — K219 Gastro-esophageal reflux disease without esophagitis: Secondary | ICD-10-CM | POA: Diagnosis not present

## 2017-06-15 DIAGNOSIS — H409 Unspecified glaucoma: Secondary | ICD-10-CM | POA: Diagnosis not present

## 2017-06-15 DIAGNOSIS — G629 Polyneuropathy, unspecified: Secondary | ICD-10-CM | POA: Diagnosis not present

## 2017-06-15 DIAGNOSIS — Z7982 Long term (current) use of aspirin: Secondary | ICD-10-CM | POA: Diagnosis not present

## 2017-06-15 DIAGNOSIS — R32 Unspecified urinary incontinence: Secondary | ICD-10-CM | POA: Diagnosis not present

## 2017-07-12 DIAGNOSIS — L989 Disorder of the skin and subcutaneous tissue, unspecified: Secondary | ICD-10-CM | POA: Diagnosis not present

## 2017-07-21 DIAGNOSIS — H401122 Primary open-angle glaucoma, left eye, moderate stage: Secondary | ICD-10-CM | POA: Diagnosis not present

## 2017-07-21 DIAGNOSIS — H40052 Ocular hypertension, left eye: Secondary | ICD-10-CM | POA: Diagnosis not present

## 2017-07-21 DIAGNOSIS — H401111 Primary open-angle glaucoma, right eye, mild stage: Secondary | ICD-10-CM | POA: Diagnosis not present

## 2017-07-21 DIAGNOSIS — H04123 Dry eye syndrome of bilateral lacrimal glands: Secondary | ICD-10-CM | POA: Diagnosis not present

## 2017-08-05 ENCOUNTER — Other Ambulatory Visit: Payer: Self-pay | Admitting: Physician Assistant

## 2017-08-05 DIAGNOSIS — R1011 Right upper quadrant pain: Secondary | ICD-10-CM | POA: Diagnosis not present

## 2017-08-05 DIAGNOSIS — K2970 Gastritis, unspecified, without bleeding: Secondary | ICD-10-CM | POA: Diagnosis not present

## 2017-08-05 DIAGNOSIS — I10 Essential (primary) hypertension: Secondary | ICD-10-CM | POA: Diagnosis not present

## 2017-08-05 DIAGNOSIS — M5410 Radiculopathy, site unspecified: Secondary | ICD-10-CM | POA: Diagnosis not present

## 2017-08-05 DIAGNOSIS — L989 Disorder of the skin and subcutaneous tissue, unspecified: Secondary | ICD-10-CM | POA: Diagnosis not present

## 2017-08-05 DIAGNOSIS — E7800 Pure hypercholesterolemia, unspecified: Secondary | ICD-10-CM | POA: Diagnosis not present

## 2017-08-05 DIAGNOSIS — Z6828 Body mass index (BMI) 28.0-28.9, adult: Secondary | ICD-10-CM | POA: Diagnosis not present

## 2017-08-05 DIAGNOSIS — G2581 Restless legs syndrome: Secondary | ICD-10-CM | POA: Diagnosis not present

## 2017-08-05 DIAGNOSIS — K219 Gastro-esophageal reflux disease without esophagitis: Secondary | ICD-10-CM | POA: Diagnosis not present

## 2017-08-05 DIAGNOSIS — G4700 Insomnia, unspecified: Secondary | ICD-10-CM | POA: Diagnosis not present

## 2017-08-17 ENCOUNTER — Ambulatory Visit
Admission: RE | Admit: 2017-08-17 | Discharge: 2017-08-17 | Disposition: A | Discharge status: Home / Self Care | Payer: Medicare HMO | Source: Ambulatory Visit | Attending: Physician Assistant | Admitting: Physician Assistant

## 2017-08-17 DIAGNOSIS — R1011 Right upper quadrant pain: Secondary | ICD-10-CM

## 2017-08-18 DIAGNOSIS — R1011 Right upper quadrant pain: Secondary | ICD-10-CM | POA: Diagnosis not present

## 2017-08-18 DIAGNOSIS — K760 Fatty (change of) liver, not elsewhere classified: Secondary | ICD-10-CM | POA: Diagnosis not present

## 2017-08-18 DIAGNOSIS — K2970 Gastritis, unspecified, without bleeding: Secondary | ICD-10-CM | POA: Diagnosis not present

## 2017-08-18 DIAGNOSIS — Z6828 Body mass index (BMI) 28.0-28.9, adult: Secondary | ICD-10-CM | POA: Diagnosis not present

## 2017-08-23 DIAGNOSIS — L989 Disorder of the skin and subcutaneous tissue, unspecified: Secondary | ICD-10-CM | POA: Diagnosis not present

## 2017-08-30 DIAGNOSIS — Z1211 Encounter for screening for malignant neoplasm of colon: Secondary | ICD-10-CM | POA: Diagnosis not present

## 2017-09-01 ENCOUNTER — Ambulatory Visit: Payer: Medicare HMO | Admitting: Podiatry

## 2017-09-01 DIAGNOSIS — Z79899 Other long term (current) drug therapy: Secondary | ICD-10-CM | POA: Diagnosis not present

## 2017-09-01 DIAGNOSIS — R195 Other fecal abnormalities: Secondary | ICD-10-CM | POA: Diagnosis not present

## 2017-09-01 DIAGNOSIS — K2970 Gastritis, unspecified, without bleeding: Secondary | ICD-10-CM | POA: Diagnosis not present

## 2017-09-01 DIAGNOSIS — R1013 Epigastric pain: Secondary | ICD-10-CM | POA: Diagnosis not present

## 2017-09-01 DIAGNOSIS — I10 Essential (primary) hypertension: Secondary | ICD-10-CM | POA: Diagnosis not present

## 2017-10-01 DIAGNOSIS — Z1212 Encounter for screening for malignant neoplasm of rectum: Secondary | ICD-10-CM | POA: Diagnosis not present

## 2017-10-04 DIAGNOSIS — H401111 Primary open-angle glaucoma, right eye, mild stage: Secondary | ICD-10-CM | POA: Diagnosis not present

## 2017-10-04 DIAGNOSIS — H04123 Dry eye syndrome of bilateral lacrimal glands: Secondary | ICD-10-CM | POA: Diagnosis not present

## 2017-10-04 DIAGNOSIS — H40052 Ocular hypertension, left eye: Secondary | ICD-10-CM | POA: Diagnosis not present

## 2017-10-04 DIAGNOSIS — H401122 Primary open-angle glaucoma, left eye, moderate stage: Secondary | ICD-10-CM | POA: Diagnosis not present

## 2017-10-05 DIAGNOSIS — I10 Essential (primary) hypertension: Secondary | ICD-10-CM | POA: Diagnosis not present

## 2017-10-05 DIAGNOSIS — Z1339 Encounter for screening examination for other mental health and behavioral disorders: Secondary | ICD-10-CM | POA: Diagnosis not present

## 2017-10-05 DIAGNOSIS — Z6828 Body mass index (BMI) 28.0-28.9, adult: Secondary | ICD-10-CM | POA: Diagnosis not present

## 2017-10-05 DIAGNOSIS — K2970 Gastritis, unspecified, without bleeding: Secondary | ICD-10-CM | POA: Diagnosis not present

## 2017-10-20 ENCOUNTER — Other Ambulatory Visit: Payer: Self-pay | Admitting: Physician Assistant

## 2017-10-20 DIAGNOSIS — R1084 Generalized abdominal pain: Secondary | ICD-10-CM

## 2017-10-20 DIAGNOSIS — K219 Gastro-esophageal reflux disease without esophagitis: Secondary | ICD-10-CM | POA: Diagnosis not present

## 2017-10-20 DIAGNOSIS — R195 Other fecal abnormalities: Secondary | ICD-10-CM | POA: Diagnosis not present

## 2017-10-20 DIAGNOSIS — R634 Abnormal weight loss: Secondary | ICD-10-CM | POA: Diagnosis not present

## 2017-10-20 DIAGNOSIS — Z86010 Personal history of colonic polyps: Secondary | ICD-10-CM | POA: Diagnosis not present

## 2017-10-20 DIAGNOSIS — K5790 Diverticulosis of intestine, part unspecified, without perforation or abscess without bleeding: Secondary | ICD-10-CM | POA: Diagnosis not present

## 2017-10-28 DIAGNOSIS — N390 Urinary tract infection, site not specified: Secondary | ICD-10-CM | POA: Diagnosis not present

## 2017-11-01 DIAGNOSIS — Z79899 Other long term (current) drug therapy: Secondary | ICD-10-CM | POA: Diagnosis not present

## 2017-11-01 DIAGNOSIS — D485 Neoplasm of uncertain behavior of skin: Secondary | ICD-10-CM | POA: Diagnosis not present

## 2017-11-01 DIAGNOSIS — B369 Superficial mycosis, unspecified: Secondary | ICD-10-CM | POA: Diagnosis not present

## 2017-11-01 DIAGNOSIS — L570 Actinic keratosis: Secondary | ICD-10-CM | POA: Diagnosis not present

## 2017-11-05 ENCOUNTER — Ambulatory Visit
Admission: RE | Admit: 2017-11-05 | Discharge: 2017-11-05 | Disposition: A | Discharge status: Home / Self Care | Payer: Medicare HMO | Source: Ambulatory Visit | Attending: Physician Assistant | Admitting: Physician Assistant

## 2017-11-05 DIAGNOSIS — R634 Abnormal weight loss: Secondary | ICD-10-CM

## 2017-11-05 DIAGNOSIS — R109 Unspecified abdominal pain: Secondary | ICD-10-CM | POA: Diagnosis not present

## 2017-11-05 DIAGNOSIS — R1084 Generalized abdominal pain: Secondary | ICD-10-CM

## 2017-11-05 DIAGNOSIS — R195 Other fecal abnormalities: Secondary | ICD-10-CM

## 2017-11-05 MED ORDER — IOPAMIDOL (ISOVUE-300) INJECTION 61%
100.0000 mL | Freq: Once | INTRAVENOUS | Status: AC | PRN
  Administered 2017-11-05: 100 mL via INTRAVENOUS

## 2017-11-09 DIAGNOSIS — Z6828 Body mass index (BMI) 28.0-28.9, adult: Secondary | ICD-10-CM | POA: Diagnosis not present

## 2017-11-09 DIAGNOSIS — N3091 Cystitis, unspecified with hematuria: Secondary | ICD-10-CM | POA: Diagnosis not present

## 2017-11-11 DIAGNOSIS — B350 Tinea barbae and tinea capitis: Secondary | ICD-10-CM | POA: Diagnosis not present

## 2017-11-25 DIAGNOSIS — H04123 Dry eye syndrome of bilateral lacrimal glands: Secondary | ICD-10-CM | POA: Diagnosis not present

## 2017-11-25 DIAGNOSIS — H401122 Primary open-angle glaucoma, left eye, moderate stage: Secondary | ICD-10-CM | POA: Diagnosis not present

## 2017-11-25 DIAGNOSIS — H40052 Ocular hypertension, left eye: Secondary | ICD-10-CM | POA: Diagnosis not present

## 2017-11-25 DIAGNOSIS — H401111 Primary open-angle glaucoma, right eye, mild stage: Secondary | ICD-10-CM | POA: Diagnosis not present

## 2017-12-08 DIAGNOSIS — Z9181 History of falling: Secondary | ICD-10-CM | POA: Diagnosis not present

## 2017-12-08 DIAGNOSIS — G4700 Insomnia, unspecified: Secondary | ICD-10-CM | POA: Diagnosis not present

## 2017-12-08 DIAGNOSIS — Z1331 Encounter for screening for depression: Secondary | ICD-10-CM | POA: Diagnosis not present

## 2017-12-08 DIAGNOSIS — K219 Gastro-esophageal reflux disease without esophagitis: Secondary | ICD-10-CM | POA: Diagnosis not present

## 2017-12-08 DIAGNOSIS — K2970 Gastritis, unspecified, without bleeding: Secondary | ICD-10-CM | POA: Diagnosis not present

## 2017-12-08 DIAGNOSIS — Z23 Encounter for immunization: Secondary | ICD-10-CM | POA: Diagnosis not present

## 2017-12-08 DIAGNOSIS — Z6827 Body mass index (BMI) 27.0-27.9, adult: Secondary | ICD-10-CM | POA: Diagnosis not present

## 2017-12-08 DIAGNOSIS — G2581 Restless legs syndrome: Secondary | ICD-10-CM | POA: Diagnosis not present

## 2017-12-08 DIAGNOSIS — I10 Essential (primary) hypertension: Secondary | ICD-10-CM | POA: Diagnosis not present

## 2017-12-08 DIAGNOSIS — L989 Disorder of the skin and subcutaneous tissue, unspecified: Secondary | ICD-10-CM | POA: Diagnosis not present

## 2018-01-03 DIAGNOSIS — L988 Other specified disorders of the skin and subcutaneous tissue: Secondary | ICD-10-CM | POA: Diagnosis not present

## 2018-01-03 IMAGING — CT CT HEAD W/O CM
4 series · 16 of 47 positions shown, 18 images · non-contrast
Comparison: None.

CLINICAL DATA: Headache with elevated blood pressure

EXAM:
CT HEAD WITHOUT CONTRAST
TECHNIQUE: Contiguous axial images were obtained from the base of the skull
through the vertex without intravenous contrast.

[Series 3: head without · axial · non-contrast · 0.39mm/px · z∈[-130,-15]mm · 7 of 31 slices shown, 9 images]
[im 4/31  brain]
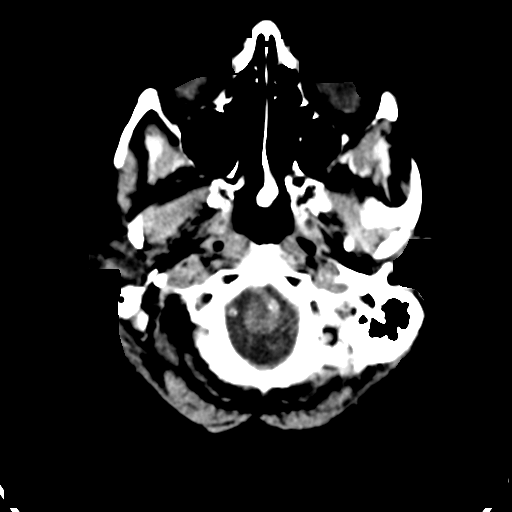
[im 4/31  bone]
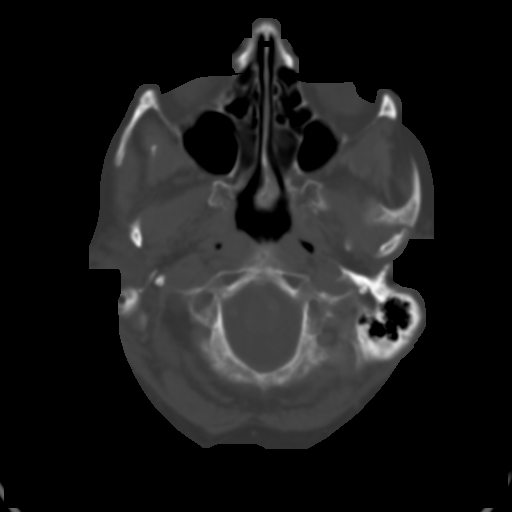
[im 8/31  brain]
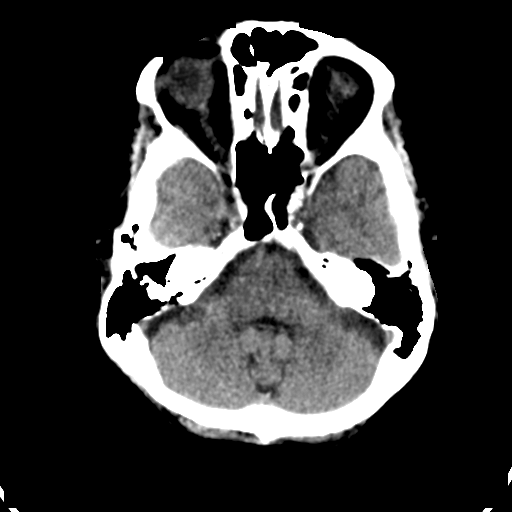
[im 12/31  brain]
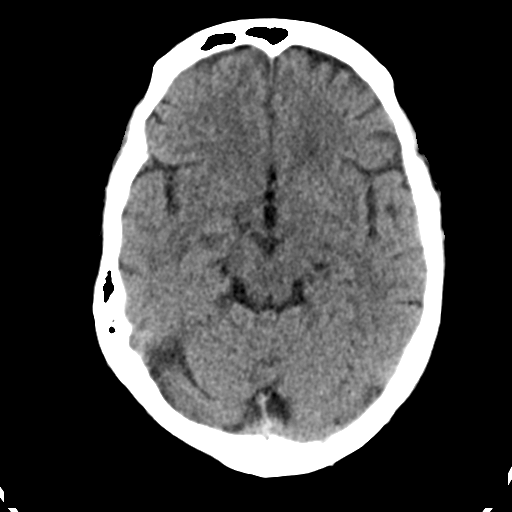
[im 16/31  brain]
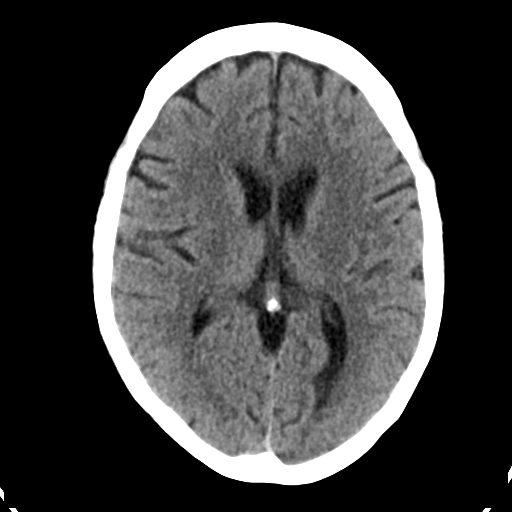
[im 19/31  brain]
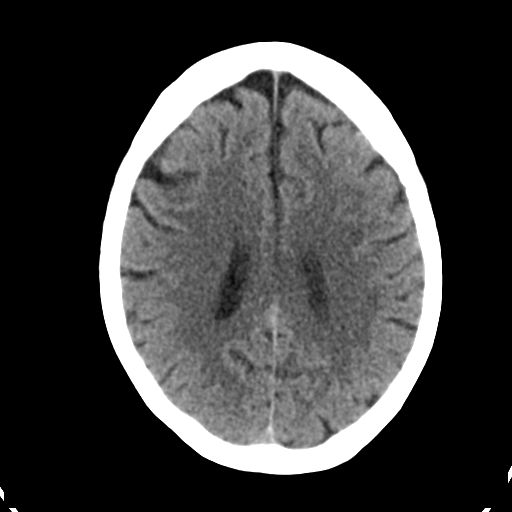
[im 19/31  bone]
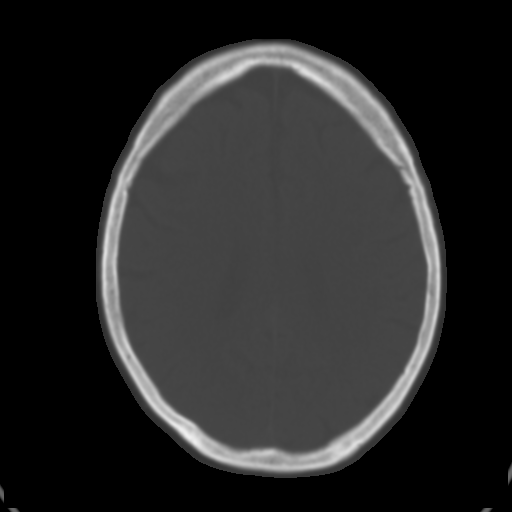
[im 23/31  brain]
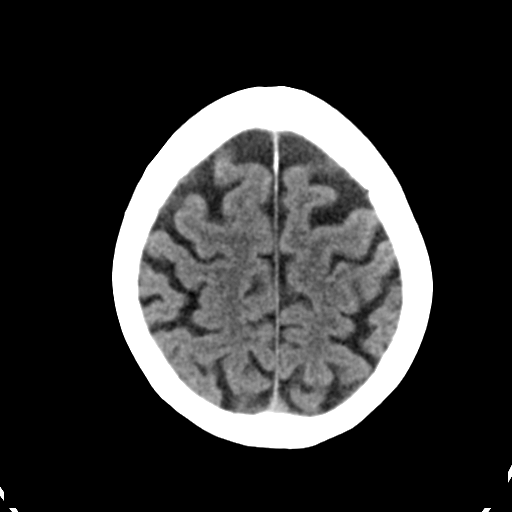
[im 27/31  brain]
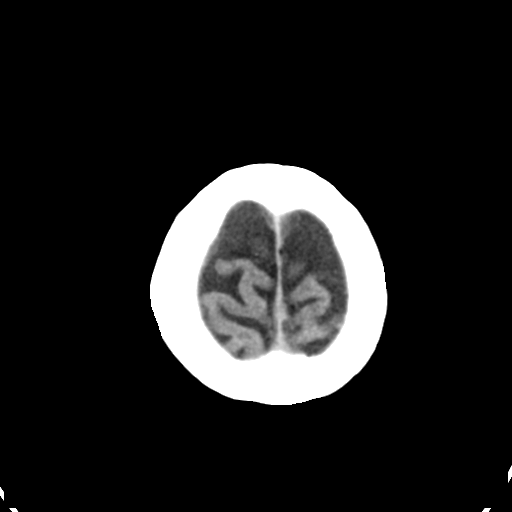

[Series 4: head bone · axial · 0.39mm/px · z∈[-131,-99]mm · 3 of 78 slices shown]
[im 8/78  bone]
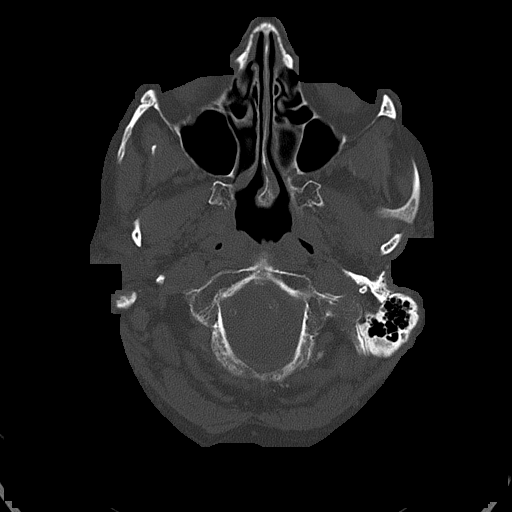
[im 16/78  bone]
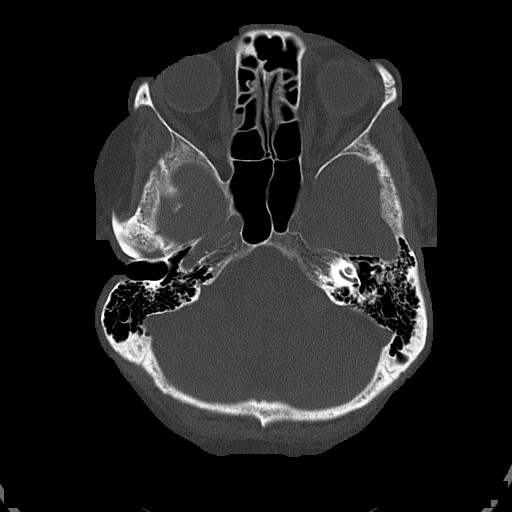
[im 24/78  bone]
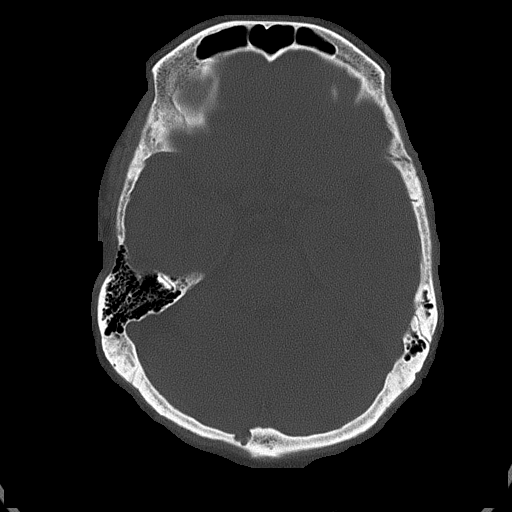

[Series 5: head without cor · coronal · non-contrast · 0.34mm/px · 3 of 67 slices shown]
[im 23/67  brain]
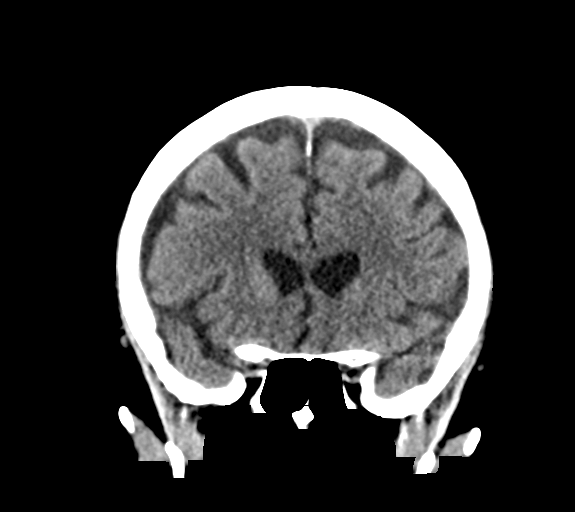
[im 30/67  brain]
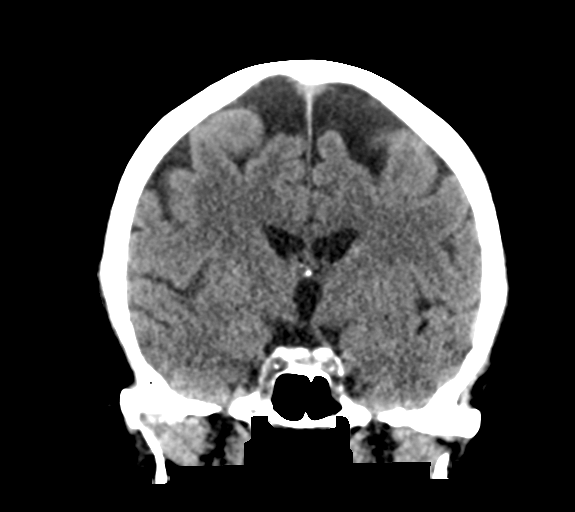
[im 37/67  brain]
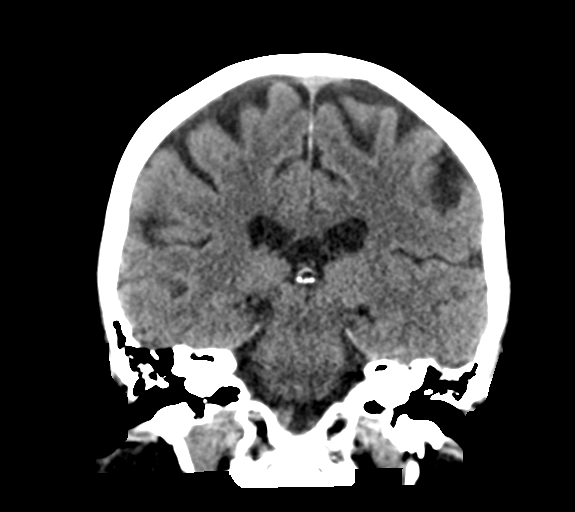

[Series 6: head without sag · sagittal · non-contrast · 0.33mm/px · 3 of 67 slices shown]
[im 23/67  brain]
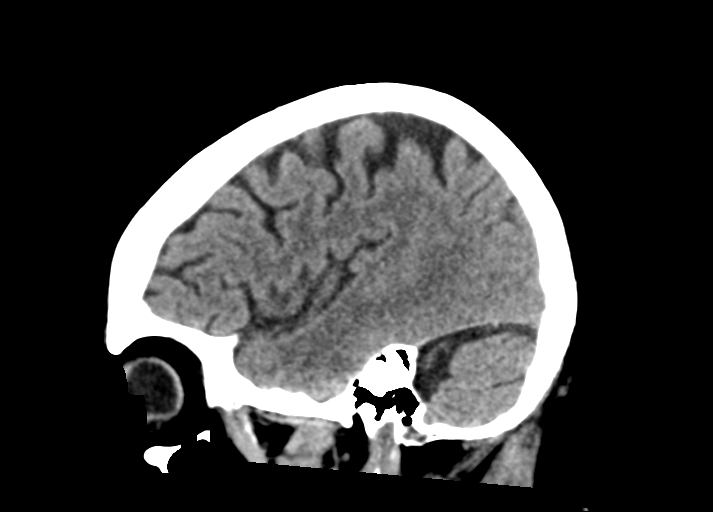
[im 34/67  brain]
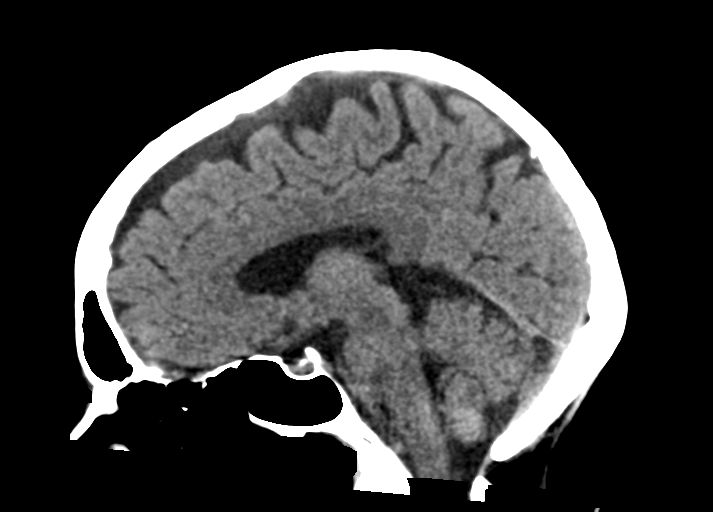
[im 45/67  brain]
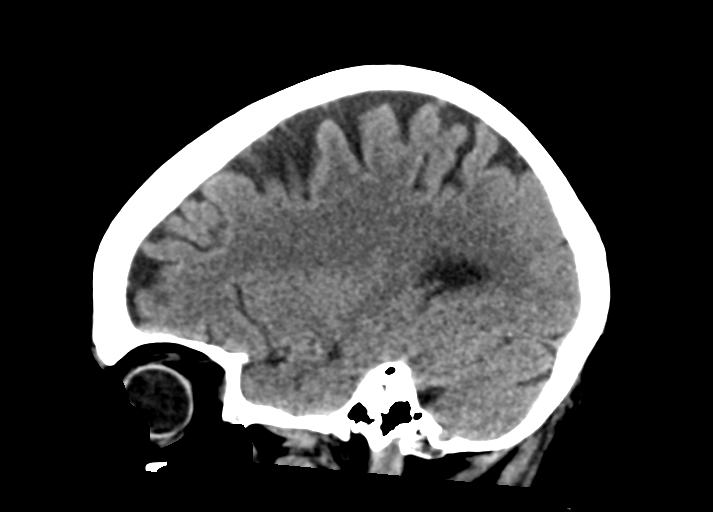

[16 of 47 positions shown; findings below may reference images not displayed]

FINDINGS: Brain: No acute territorial infarction, hemorrhage, or intracranial
mass is seen. Mild atrophy. Normal ventricle size.

Vascular: No hyperdense vessels.  Carotid artery calcifications.

Skull: No fracture or suspicious bone lesion.

Sinuses/Orbits: Mild mucosal thickening in the ethmoid sinuses. No
acute orbital abnormality.

Other: None
IMPRESSION: No CT evidence for acute intracranial abnormality.

## 2018-01-21 DIAGNOSIS — H40052 Ocular hypertension, left eye: Secondary | ICD-10-CM | POA: Diagnosis not present

## 2018-01-21 DIAGNOSIS — H401122 Primary open-angle glaucoma, left eye, moderate stage: Secondary | ICD-10-CM | POA: Diagnosis not present

## 2018-01-21 DIAGNOSIS — H401111 Primary open-angle glaucoma, right eye, mild stage: Secondary | ICD-10-CM | POA: Diagnosis not present

## 2018-02-07 DIAGNOSIS — L988 Other specified disorders of the skin and subcutaneous tissue: Secondary | ICD-10-CM | POA: Diagnosis not present

## 2018-02-08 DIAGNOSIS — K2970 Gastritis, unspecified, without bleeding: Secondary | ICD-10-CM | POA: Diagnosis not present

## 2018-02-08 DIAGNOSIS — K219 Gastro-esophageal reflux disease without esophagitis: Secondary | ICD-10-CM | POA: Diagnosis not present

## 2018-02-08 DIAGNOSIS — Z6827 Body mass index (BMI) 27.0-27.9, adult: Secondary | ICD-10-CM | POA: Diagnosis not present

## 2018-02-08 DIAGNOSIS — I10 Essential (primary) hypertension: Secondary | ICD-10-CM | POA: Diagnosis not present

## 2018-02-08 DIAGNOSIS — G4700 Insomnia, unspecified: Secondary | ICD-10-CM | POA: Diagnosis not present

## 2018-02-08 DIAGNOSIS — G2581 Restless legs syndrome: Secondary | ICD-10-CM | POA: Diagnosis not present

## 2018-02-08 DIAGNOSIS — L989 Disorder of the skin and subcutaneous tissue, unspecified: Secondary | ICD-10-CM | POA: Diagnosis not present

## 2018-02-08 DIAGNOSIS — L309 Dermatitis, unspecified: Secondary | ICD-10-CM | POA: Diagnosis not present

## 2018-03-14 DIAGNOSIS — R69 Illness, unspecified: Secondary | ICD-10-CM | POA: Diagnosis not present

## 2018-04-20 DIAGNOSIS — L570 Actinic keratosis: Secondary | ICD-10-CM | POA: Diagnosis not present

## 2018-04-20 DIAGNOSIS — L309 Dermatitis, unspecified: Secondary | ICD-10-CM | POA: Diagnosis not present

## 2018-04-28 DIAGNOSIS — L989 Disorder of the skin and subcutaneous tissue, unspecified: Secondary | ICD-10-CM | POA: Diagnosis not present

## 2018-04-28 DIAGNOSIS — Z4802 Encounter for removal of sutures: Secondary | ICD-10-CM | POA: Diagnosis not present

## 2018-04-28 DIAGNOSIS — Z6827 Body mass index (BMI) 27.0-27.9, adult: Secondary | ICD-10-CM | POA: Diagnosis not present

## 2018-05-02 DIAGNOSIS — R69 Illness, unspecified: Secondary | ICD-10-CM | POA: Diagnosis not present

## 2018-05-05 DIAGNOSIS — H401122 Primary open-angle glaucoma, left eye, moderate stage: Secondary | ICD-10-CM | POA: Diagnosis not present

## 2018-05-05 DIAGNOSIS — H401111 Primary open-angle glaucoma, right eye, mild stage: Secondary | ICD-10-CM | POA: Diagnosis not present

## 2018-05-10 DIAGNOSIS — I10 Essential (primary) hypertension: Secondary | ICD-10-CM | POA: Diagnosis not present

## 2018-05-10 DIAGNOSIS — G2581 Restless legs syndrome: Secondary | ICD-10-CM | POA: Diagnosis not present

## 2018-05-10 DIAGNOSIS — G4700 Insomnia, unspecified: Secondary | ICD-10-CM | POA: Diagnosis not present

## 2018-05-10 DIAGNOSIS — K219 Gastro-esophageal reflux disease without esophagitis: Secondary | ICD-10-CM | POA: Diagnosis not present

## 2018-05-10 DIAGNOSIS — G629 Polyneuropathy, unspecified: Secondary | ICD-10-CM | POA: Diagnosis not present

## 2018-05-10 DIAGNOSIS — Z809 Family history of malignant neoplasm, unspecified: Secondary | ICD-10-CM | POA: Diagnosis not present

## 2018-05-10 DIAGNOSIS — Z7722 Contact with and (suspected) exposure to environmental tobacco smoke (acute) (chronic): Secondary | ICD-10-CM | POA: Diagnosis not present

## 2018-05-10 DIAGNOSIS — R32 Unspecified urinary incontinence: Secondary | ICD-10-CM | POA: Diagnosis not present

## 2018-05-10 DIAGNOSIS — E785 Hyperlipidemia, unspecified: Secondary | ICD-10-CM | POA: Diagnosis not present

## 2018-05-10 DIAGNOSIS — L988 Other specified disorders of the skin and subcutaneous tissue: Secondary | ICD-10-CM | POA: Diagnosis not present

## 2018-05-10 DIAGNOSIS — H409 Unspecified glaucoma: Secondary | ICD-10-CM | POA: Diagnosis not present

## 2018-05-10 DIAGNOSIS — L570 Actinic keratosis: Secondary | ICD-10-CM | POA: Diagnosis not present

## 2018-05-10 DIAGNOSIS — Z79899 Other long term (current) drug therapy: Secondary | ICD-10-CM | POA: Diagnosis not present

## 2018-05-27 DIAGNOSIS — N3091 Cystitis, unspecified with hematuria: Secondary | ICD-10-CM | POA: Diagnosis not present

## 2018-05-27 DIAGNOSIS — Z6827 Body mass index (BMI) 27.0-27.9, adult: Secondary | ICD-10-CM | POA: Diagnosis not present

## 2018-06-10 DIAGNOSIS — L578 Other skin changes due to chronic exposure to nonionizing radiation: Secondary | ICD-10-CM | POA: Diagnosis not present

## 2018-06-10 DIAGNOSIS — L570 Actinic keratosis: Secondary | ICD-10-CM | POA: Diagnosis not present

## 2018-06-10 DIAGNOSIS — Z79899 Other long term (current) drug therapy: Secondary | ICD-10-CM | POA: Diagnosis not present

## 2018-06-10 DIAGNOSIS — L989 Disorder of the skin and subcutaneous tissue, unspecified: Secondary | ICD-10-CM | POA: Diagnosis not present

## 2018-08-10 DIAGNOSIS — K219 Gastro-esophageal reflux disease without esophagitis: Secondary | ICD-10-CM | POA: Diagnosis not present

## 2018-08-10 DIAGNOSIS — Z6827 Body mass index (BMI) 27.0-27.9, adult: Secondary | ICD-10-CM | POA: Diagnosis not present

## 2018-08-10 DIAGNOSIS — L989 Disorder of the skin and subcutaneous tissue, unspecified: Secondary | ICD-10-CM | POA: Diagnosis not present

## 2018-08-10 DIAGNOSIS — I10 Essential (primary) hypertension: Secondary | ICD-10-CM | POA: Diagnosis not present

## 2018-08-10 DIAGNOSIS — K2970 Gastritis, unspecified, without bleeding: Secondary | ICD-10-CM | POA: Diagnosis not present

## 2018-08-10 DIAGNOSIS — Z79899 Other long term (current) drug therapy: Secondary | ICD-10-CM | POA: Diagnosis not present

## 2018-08-10 DIAGNOSIS — G2581 Restless legs syndrome: Secondary | ICD-10-CM | POA: Diagnosis not present

## 2018-08-10 DIAGNOSIS — G4700 Insomnia, unspecified: Secondary | ICD-10-CM | POA: Diagnosis not present

## 2018-09-12 DIAGNOSIS — L988 Other specified disorders of the skin and subcutaneous tissue: Secondary | ICD-10-CM | POA: Diagnosis not present

## 2018-10-10 DIAGNOSIS — Z79899 Other long term (current) drug therapy: Secondary | ICD-10-CM | POA: Diagnosis not present

## 2018-10-10 DIAGNOSIS — C4442 Squamous cell carcinoma of skin of scalp and neck: Secondary | ICD-10-CM | POA: Diagnosis not present

## 2018-10-10 DIAGNOSIS — D485 Neoplasm of uncertain behavior of skin: Secondary | ICD-10-CM | POA: Diagnosis not present

## 2018-10-10 DIAGNOSIS — L989 Disorder of the skin and subcutaneous tissue, unspecified: Secondary | ICD-10-CM | POA: Diagnosis not present

## 2018-10-20 DIAGNOSIS — E785 Hyperlipidemia, unspecified: Secondary | ICD-10-CM | POA: Diagnosis not present

## 2018-10-20 DIAGNOSIS — Z9181 History of falling: Secondary | ICD-10-CM | POA: Diagnosis not present

## 2018-10-20 DIAGNOSIS — Z1331 Encounter for screening for depression: Secondary | ICD-10-CM | POA: Diagnosis not present

## 2018-10-20 DIAGNOSIS — Z139 Encounter for screening, unspecified: Secondary | ICD-10-CM | POA: Diagnosis not present

## 2018-10-20 DIAGNOSIS — Z0000 Encounter for general adult medical examination without abnormal findings: Secondary | ICD-10-CM | POA: Diagnosis not present

## 2018-10-26 ENCOUNTER — Other Ambulatory Visit (HOSPITAL_COMMUNITY): Payer: Self-pay | Admitting: Dermatology

## 2018-10-26 ENCOUNTER — Other Ambulatory Visit: Payer: Self-pay | Admitting: Dermatology

## 2018-10-26 DIAGNOSIS — C4442 Squamous cell carcinoma of skin of scalp and neck: Secondary | ICD-10-CM

## 2018-10-31 DIAGNOSIS — H04123 Dry eye syndrome of bilateral lacrimal glands: Secondary | ICD-10-CM | POA: Diagnosis not present

## 2018-10-31 DIAGNOSIS — H401122 Primary open-angle glaucoma, left eye, moderate stage: Secondary | ICD-10-CM | POA: Diagnosis not present

## 2018-10-31 DIAGNOSIS — H401111 Primary open-angle glaucoma, right eye, mild stage: Secondary | ICD-10-CM | POA: Diagnosis not present

## 2018-10-31 DIAGNOSIS — H1045 Other chronic allergic conjunctivitis: Secondary | ICD-10-CM | POA: Diagnosis not present

## 2018-11-01 ENCOUNTER — Other Ambulatory Visit: Payer: Self-pay | Admitting: Dermatology

## 2018-11-01 DIAGNOSIS — C4442 Squamous cell carcinoma of skin of scalp and neck: Secondary | ICD-10-CM

## 2018-11-01 DIAGNOSIS — L988 Other specified disorders of the skin and subcutaneous tissue: Secondary | ICD-10-CM

## 2018-11-01 DIAGNOSIS — L989 Disorder of the skin and subcutaneous tissue, unspecified: Secondary | ICD-10-CM

## 2018-11-04 DIAGNOSIS — H401111 Primary open-angle glaucoma, right eye, mild stage: Secondary | ICD-10-CM | POA: Diagnosis not present

## 2018-11-09 DIAGNOSIS — L988 Other specified disorders of the skin and subcutaneous tissue: Secondary | ICD-10-CM | POA: Diagnosis not present

## 2018-11-09 DIAGNOSIS — D044 Carcinoma in situ of skin of scalp and neck: Secondary | ICD-10-CM | POA: Diagnosis not present

## 2018-11-09 DIAGNOSIS — L598 Other specified disorders of the skin and subcutaneous tissue related to radiation: Secondary | ICD-10-CM | POA: Diagnosis not present

## 2018-11-10 DIAGNOSIS — L988 Other specified disorders of the skin and subcutaneous tissue: Secondary | ICD-10-CM | POA: Diagnosis not present

## 2018-11-14 ENCOUNTER — Other Ambulatory Visit: Payer: Medicare HMO

## 2018-11-23 DIAGNOSIS — H401122 Primary open-angle glaucoma, left eye, moderate stage: Secondary | ICD-10-CM | POA: Diagnosis not present

## 2018-11-23 DIAGNOSIS — H40052 Ocular hypertension, left eye: Secondary | ICD-10-CM | POA: Diagnosis not present

## 2018-11-25 DIAGNOSIS — M545 Low back pain: Secondary | ICD-10-CM | POA: Diagnosis not present

## 2018-12-05 ENCOUNTER — Ambulatory Visit
Admission: RE | Admit: 2018-12-05 | Discharge: 2018-12-05 | Disposition: A | Discharge status: Home / Self Care | Payer: Medicare HMO | Source: Ambulatory Visit | Attending: Dermatology | Admitting: Dermatology

## 2018-12-05 ENCOUNTER — Other Ambulatory Visit: Payer: Self-pay

## 2018-12-05 ENCOUNTER — Other Ambulatory Visit: Payer: Medicare HMO

## 2018-12-05 DIAGNOSIS — C4442 Squamous cell carcinoma of skin of scalp and neck: Secondary | ICD-10-CM | POA: Diagnosis not present

## 2018-12-05 DIAGNOSIS — L989 Disorder of the skin and subcutaneous tissue, unspecified: Secondary | ICD-10-CM

## 2018-12-05 DIAGNOSIS — M2578 Osteophyte, vertebrae: Secondary | ICD-10-CM | POA: Diagnosis not present

## 2018-12-05 DIAGNOSIS — I6523 Occlusion and stenosis of bilateral carotid arteries: Secondary | ICD-10-CM | POA: Diagnosis not present

## 2018-12-05 DIAGNOSIS — L988 Other specified disorders of the skin and subcutaneous tissue: Secondary | ICD-10-CM

## 2018-12-05 DIAGNOSIS — I700 Atherosclerosis of aorta: Secondary | ICD-10-CM | POA: Diagnosis not present

## 2018-12-05 DIAGNOSIS — M47812 Spondylosis without myelopathy or radiculopathy, cervical region: Secondary | ICD-10-CM | POA: Diagnosis not present

## 2018-12-05 DIAGNOSIS — Z85820 Personal history of malignant melanoma of skin: Secondary | ICD-10-CM | POA: Diagnosis not present

## 2018-12-05 MED ORDER — IOPAMIDOL (ISOVUE-300) INJECTION 61%
75.0000 mL | Freq: Once | INTRAVENOUS | Status: AC | PRN
  Administered 2018-12-05: 60 mL via INTRAVENOUS

## 2018-12-07 DIAGNOSIS — Z23 Encounter for immunization: Secondary | ICD-10-CM | POA: Diagnosis not present

## 2018-12-21 DIAGNOSIS — H40052 Ocular hypertension, left eye: Secondary | ICD-10-CM | POA: Diagnosis not present

## 2018-12-21 DIAGNOSIS — H401111 Primary open-angle glaucoma, right eye, mild stage: Secondary | ICD-10-CM | POA: Diagnosis not present

## 2018-12-21 DIAGNOSIS — H401122 Primary open-angle glaucoma, left eye, moderate stage: Secondary | ICD-10-CM | POA: Diagnosis not present

## 2019-01-30 DIAGNOSIS — C4442 Squamous cell carcinoma of skin of scalp and neck: Secondary | ICD-10-CM | POA: Diagnosis not present

## 2019-02-13 DIAGNOSIS — G2581 Restless legs syndrome: Secondary | ICD-10-CM | POA: Diagnosis not present

## 2019-02-13 DIAGNOSIS — G629 Polyneuropathy, unspecified: Secondary | ICD-10-CM | POA: Diagnosis not present

## 2019-02-13 DIAGNOSIS — K219 Gastro-esophageal reflux disease without esophagitis: Secondary | ICD-10-CM | POA: Diagnosis not present

## 2019-02-13 DIAGNOSIS — K2970 Gastritis, unspecified, without bleeding: Secondary | ICD-10-CM | POA: Diagnosis not present

## 2019-02-13 DIAGNOSIS — Z6826 Body mass index (BMI) 26.0-26.9, adult: Secondary | ICD-10-CM | POA: Diagnosis not present

## 2019-02-13 DIAGNOSIS — M545 Low back pain: Secondary | ICD-10-CM | POA: Diagnosis not present

## 2019-02-13 DIAGNOSIS — E7800 Pure hypercholesterolemia, unspecified: Secondary | ICD-10-CM | POA: Diagnosis not present

## 2019-02-13 DIAGNOSIS — G4700 Insomnia, unspecified: Secondary | ICD-10-CM | POA: Diagnosis not present

## 2019-02-13 DIAGNOSIS — I10 Essential (primary) hypertension: Secondary | ICD-10-CM | POA: Diagnosis not present

## 2019-02-13 DIAGNOSIS — L989 Disorder of the skin and subcutaneous tissue, unspecified: Secondary | ICD-10-CM | POA: Diagnosis not present

## 2019-02-22 ENCOUNTER — Institutional Professional Consult (permissible substitution): Payer: Medicare HMO | Admitting: Plastic Surgery

## 2019-02-24 DIAGNOSIS — M545 Low back pain: Secondary | ICD-10-CM | POA: Diagnosis not present

## 2019-02-24 DIAGNOSIS — M7062 Trochanteric bursitis, left hip: Secondary | ICD-10-CM | POA: Diagnosis not present

## 2019-03-02 DIAGNOSIS — Z6827 Body mass index (BMI) 27.0-27.9, adult: Secondary | ICD-10-CM | POA: Diagnosis not present

## 2019-03-02 DIAGNOSIS — I10 Essential (primary) hypertension: Secondary | ICD-10-CM | POA: Diagnosis not present

## 2019-03-16 ENCOUNTER — Encounter: Payer: Self-pay | Admitting: Plastic Surgery

## 2019-03-16 ENCOUNTER — Other Ambulatory Visit: Payer: Self-pay

## 2019-03-16 ENCOUNTER — Ambulatory Visit: Payer: Medicare HMO | Admitting: Plastic Surgery

## 2019-03-16 VITALS — BP 130/75 | HR 71 | Temp 97.1°F | Ht 64.0 in | Wt 148.0 lb

## 2019-03-16 DIAGNOSIS — C4442 Squamous cell carcinoma of skin of scalp and neck: Secondary | ICD-10-CM | POA: Diagnosis not present

## 2019-03-16 NOTE — Progress Notes (Signed)
Referring Provider Cyndi Bender, PA-C [place],  [place] [address]   CC:  Chief Complaint  Patient presents with  . Advice Only    mohs reconstruction      Crystal Vazquez is an 85 y.o. female.  HPI: Patient presents to discuss reconstruction after Mohs excision of squamous cell carcinoma of the scalp.  She has had a number of squamous cells excised from her scalp that have gone on to heal.  She has had previous radiation therapy.  She is 85 years old but does not have a lot of other medical problems.  The current plan involves excision of another squamous cell carcinoma that will likely go down to bone.  She has had a CT scan and it does not appear that this invades the bone according to her.  She has not had previous flap or graft surgery on her scalp as far she can remember.  Allergies  Allergen Reactions  . Sulfa Antibiotics Hives  . Monosodium Glutamate Other (See Comments)    unknown  . Penicillins Hives    Has patient had a PCN reaction causing immediate rash, facial/tongue/throat swelling, SOB or lightheadedness with hypotension: Yes Has patient had a PCN reaction causing severe rash involving mucus membranes or skin necrosis: No Has patient had a PCN reaction that required hospitalization No Has patient had a PCN reaction occurring within the last 10 years: No If all of the above answers are "NO", then may proceed with Cephalosporin use.    . Sulfa Drugs Cross Reactors Hives  . Aleve [Naproxen Sodium] Nausea Only  . Codeine Rash  . Red Dye Rash    Outpatient Encounter Medications as of 03/16/2019  Medication Sig  . acetaminophen (TYLENOL) 500 MG tablet Take 500 mg by mouth every 6 (six) hours as needed for mild pain.  Marland Kitchen atorvastatin (LIPITOR) 40 MG tablet Take 40 mg by mouth daily at 6 PM.   . gabapentin (NEURONTIN) 300 MG capsule Take 300 mg by mouth 2 (two) times daily.    Marland Kitchen latanoprost (XALATAN) 0.005 % ophthalmic solution Place 1 drop into the  left eye at bedtime.    Marland Kitchen losartan (COZAAR) 25 MG tablet Take 50 mg by mouth daily.   Marland Kitchen omeprazole (PRILOSEC) 40 MG capsule Take 40 mg by mouth daily.  . traZODone (DESYREL) 50 MG tablet Take 1 tablet by mouth at bedtime.  Marland Kitchen zolpidem (AMBIEN) 5 MG tablet Take 5 mg by mouth at bedtime.   . [DISCONTINUED] aspirin 81 MG tablet Take 81 mg by mouth daily.    . [DISCONTINUED] clobetasol ointment (TEMOVATE) [id number] % Apply 1 application topically daily as needed (ITCHING).   . [DISCONTINUED] dapsone 25 MG tablet Take 25 mg by mouth 2 (two) times daily.  . [DISCONTINUED] HYDROcodone-acetaminophen (NORCO) 5-325 MG tablet Take 1 tablet by mouth every 4 (four) hours as needed for moderate pain. (Patient not taking: Reported on 12/05/2016)   No facility-administered encounter medications on file as of 03/16/2019.     Past Medical History:  Diagnosis Date  . Arthritis   . Cancer (HCC)    SCC Scalp  . Diverticulosis of colon (without mention of hemorrhage) 2003   colonoscopy 2003  . Gastritis 2008   endoscopy 2008  . GERD (gastroesophageal reflux disease)   . Glaucoma    left eye  . Hypertension   . Insomnia   . Melanoma ([place]) 1996   on back, excised with no recurrence  . Neuropathy  feet and legs  . Restless legs syndrome (RLS)   . Urinary incontinence   . Wears glasses     Past Surgical History:  Procedure Laterality Date  . ABDOMINAL HYSTERECTOMY    . COLONOSCOPY    . ESOPHAGOGASTRODUODENOSCOPY    . REVERSE SHOULDER ARTHROPLASTY Right 02/14/2016   Procedure: RIGHT REVERSE SHOULDER ARTHROPLASTY;  Surgeon: Netta Cedars, MD;  Location: [facility];  Service: Orthopedics;  Laterality: Right;    Family History  Problem Relation Age of Onset  . Alzheimer's disease Sister   . Breast cancer Unknown        sisters, aunt  . Aneurysm Mother        AAA  . Heart disease Mother        mother, aunts, uncles had"heart problems" unknown to pt  . Hypertension Unknown        multiple family members    . Diabetes type II Brother     Social History   Social History Narrative   Lives with husband. Has been retired for 16 years. 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 Has one son who lives near [place] with his wife and son.     Review of Systems General: Denies fevers, chills, weight loss CV: Denies chest pain, shortness of breath, palpitations  Physical Exam Vitals with BMI 03/16/2019 06/02/2017 12/05/2016  Height 5\' 4"  - -  Weight 148 lbs - -  BMI 123456 - -  Systolic [id number] Q000111Q [id number]  Diastolic 75 69 71  Pulse 71 65 71    General:  No acute distress,  Alert and oriented, Non-Toxic, Normal speech and affect HEENT: Normocephalic atraumatic.  Cranial nerves grossly intact.  Extraocular is intact.  The majority of her scalp vertex is full of erythematous scaly lesions.  There is a few areas that look worrisome certainly 1 of which is the squamous cell carcinoma that she had biopsied previously.  It does appear to be evidence of telangiectasias giving the skin the appearance of prior radiation therapy.  Assessment/Plan Patient presents in consultation for planned Mohs excision of squamous cell carcinoma of the scalp.  I explained the challenges of her situation including prior radiation therapy combined with such a large area of her scalp that has concerning lesions.  For these reasons I do not think she would be a good candidate for local tissue transfer.  Furthermore if she has exposed bone she would not be a good candidate for free flap given her age and the fact that she does not want it.  I suspect that the reconstructive option she will end up choosing involves wound care with the potential adjunct of skin substitutes.  We will plan to examine the defect after the excision and make the appropriate plans.  I have explained the risks for her and the fact that this will likely take a while to heal afterwards.  [name] 03/16/2019, 5:57 PM

## 2019-03-21 DIAGNOSIS — M7062 Trochanteric bursitis, left hip: Secondary | ICD-10-CM | POA: Diagnosis not present

## 2019-03-21 DIAGNOSIS — Z6827 Body mass index (BMI) 27.0-27.9, adult: Secondary | ICD-10-CM | POA: Diagnosis not present

## 2019-03-21 DIAGNOSIS — I10 Essential (primary) hypertension: Secondary | ICD-10-CM | POA: Diagnosis not present

## 2019-03-29 ENCOUNTER — Encounter: Payer: Self-pay | Admitting: Plastic Surgery

## 2019-03-29 ENCOUNTER — Other Ambulatory Visit: Payer: Self-pay

## 2019-03-29 ENCOUNTER — Ambulatory Visit (INDEPENDENT_AMBULATORY_CARE_PROVIDER_SITE_OTHER): Payer: Medicare HMO | Admitting: Plastic Surgery

## 2019-03-29 VITALS — BP 142/83 | HR 67 | Temp 98.3°F | Ht 67.0 in | Wt 148.0 lb

## 2019-03-29 DIAGNOSIS — C4442 Squamous cell carcinoma of skin of scalp and neck: Secondary | ICD-10-CM

## 2019-03-29 NOTE — H&P (View-Only) (Signed)
   Referring Provider Cyndi Bender, PA-C [place],  [place] [address]   CC: No chief complaint on file. Large scalp Mohs surgery defect  Crystal Vazquez is an 86 y.o. female.  HPI: Patient presents after her Mohs excision of a large squamous cell cancer on her scalp.  The wound was it is about 10 x 10 cm and she is here to discuss her reconstructive options.  Review of Systems General: Denies fevers, chills, changes in weight  Physical Exam Vitals with BMI 03/29/2019 03/16/2019 06/02/2017  Height 5\' 7"  5\' 4"  -  Weight 148 lbs 148 lbs -  BMI 123456 123456 -  Systolic A999333 [id number] Q000111Q  Diastolic 83 75 69  Pulse 67 71 65    General:  No acute distress,  Alert and oriented, Non-Toxic, Normal speech and affect Examination of the scalp shows around a 10 x 10 cm wound.  There is very little paracranium left and one area that is around 2 and half by 2 and half centimeters of bare skull.  Assessment/Plan Patient presents with a complex defect of her scalp.  As I discussed with her ahead of time she has limited reconstructive options as a rotational flap would not be ideal in her case given the size of the wound and the radiation changes and I do not think she is a great candidate for free flap either.  I think would be reasonable to try a skin substitute product like prime matrix on this to see if that can help it heal and facilitate the development of granulation tissue over the skull.  Subsequent to that it could be skin grafted her left to continue to heal.  I will plan to see if I could obtain the product and do this in the office as the logistics of her in her 1 year old husband going to the surgery center would be somewhat difficult for them.  We will be in touch with them to schedule application of prime matrix in the office.  [name] 03/29/2019, 2:46 PM

## 2019-03-29 NOTE — Progress Notes (Signed)
   Referring Provider Cyndi Bender, PA-C [place],  [place] [address]   CC: No chief complaint on file. Large scalp Mohs surgery defect  Crystal Vazquez is an 86 y.o. female.  HPI: Patient presents after her Mohs excision of a large squamous cell cancer on her scalp.  The wound was it is about 10 x 10 cm and she is here to discuss her reconstructive options.  Review of Systems General: Denies fevers, chills, changes in weight  Physical Exam Vitals with BMI 03/29/2019 03/16/2019 06/02/2017  Height 5\' 7"  5\' 4"  -  Weight 148 lbs 148 lbs -  BMI 123456 123456 -  Systolic A999333 [id number] Q000111Q  Diastolic 83 75 69  Pulse 67 71 65    General:  No acute distress,  Alert and oriented, Non-Toxic, Normal speech and affect Examination of the scalp shows around a 10 x 10 cm wound.  There is very little paracranium left and one area that is around 2 and half by 2 and half centimeters of bare skull.  Assessment/Plan Patient presents with a complex defect of her scalp.  As I discussed with her ahead of time she has limited reconstructive options as a rotational flap would not be ideal in her case given the size of the wound and the radiation changes and I do not think she is a great candidate for free flap either.  I think would be reasonable to try a skin substitute product like prime matrix on this to see if that can help it heal and facilitate the development of granulation tissue over the skull.  Subsequent to that it could be skin grafted her left to continue to heal.  I will plan to see if I could obtain the product and do this in the office as the logistics of her in her 15 year old husband going to the surgery center would be somewhat difficult for them.  We will be in touch with them to schedule application of prime matrix in the office.  [name] 03/29/2019, 2:46 PM

## 2019-03-30 ENCOUNTER — Encounter (HOSPITAL_BASED_OUTPATIENT_CLINIC_OR_DEPARTMENT_OTHER): Payer: Self-pay | Admitting: Plastic Surgery

## 2019-03-30 ENCOUNTER — Other Ambulatory Visit: Payer: Self-pay

## 2019-03-30 ENCOUNTER — Other Ambulatory Visit (HOSPITAL_COMMUNITY)
Admission: RE | Admit: 2019-03-30 | Discharge: 2019-03-30 | Disposition: A | Discharge status: Home / Self Care | Payer: Medicare HMO | Source: Ambulatory Visit | Attending: Plastic Surgery | Admitting: Plastic Surgery

## 2019-03-30 DIAGNOSIS — Z01812 Encounter for preprocedural laboratory examination: Secondary | ICD-10-CM | POA: Insufficient documentation

## 2019-03-30 DIAGNOSIS — Z20822 Contact with and (suspected) exposure to covid-19: Secondary | ICD-10-CM | POA: Diagnosis not present

## 2019-03-31 LAB — NOVEL CORONAVIRUS, NAA (HOSP ORDER, SEND-OUT TO REF LAB; TAT 18-24 HRS): SARS-CoV-2, NAA: NOT DETECTED

## 2019-04-03 ENCOUNTER — Ambulatory Visit (HOSPITAL_BASED_OUTPATIENT_CLINIC_OR_DEPARTMENT_OTHER): Payer: Medicare HMO | Admitting: Anesthesiology

## 2019-04-03 ENCOUNTER — Ambulatory Visit (HOSPITAL_BASED_OUTPATIENT_CLINIC_OR_DEPARTMENT_OTHER)
Admission: RE | Admit: 2019-04-03 | Discharge: 2019-04-03 | Disposition: A | Discharge status: Home / Self Care | Payer: Medicare HMO | Attending: Plastic Surgery | Admitting: Plastic Surgery

## 2019-04-03 ENCOUNTER — Encounter (HOSPITAL_BASED_OUTPATIENT_CLINIC_OR_DEPARTMENT_OTHER)
Admission: RE | Disposition: A | Discharge status: Home / Self Care | Payer: Self-pay | Source: Home / Self Care | Attending: Plastic Surgery

## 2019-04-03 ENCOUNTER — Other Ambulatory Visit: Payer: Self-pay

## 2019-04-03 ENCOUNTER — Encounter (HOSPITAL_BASED_OUTPATIENT_CLINIC_OR_DEPARTMENT_OTHER): Payer: Self-pay | Admitting: Plastic Surgery

## 2019-04-03 DIAGNOSIS — Z79899 Other long term (current) drug therapy: Secondary | ICD-10-CM | POA: Diagnosis not present

## 2019-04-03 DIAGNOSIS — G629 Polyneuropathy, unspecified: Secondary | ICD-10-CM | POA: Insufficient documentation

## 2019-04-03 DIAGNOSIS — S0100XA Unspecified open wound of scalp, initial encounter: Secondary | ICD-10-CM | POA: Diagnosis not present

## 2019-04-03 DIAGNOSIS — I10 Essential (primary) hypertension: Secondary | ICD-10-CM | POA: Insufficient documentation

## 2019-04-03 DIAGNOSIS — Z9889 Other specified postprocedural states: Secondary | ICD-10-CM | POA: Diagnosis not present

## 2019-04-03 DIAGNOSIS — E785 Hyperlipidemia, unspecified: Secondary | ICD-10-CM | POA: Diagnosis not present

## 2019-04-03 DIAGNOSIS — Z85828 Personal history of other malignant neoplasm of skin: Secondary | ICD-10-CM | POA: Insufficient documentation

## 2019-04-03 DIAGNOSIS — K219 Gastro-esophageal reflux disease without esophagitis: Secondary | ICD-10-CM | POA: Insufficient documentation

## 2019-04-03 DIAGNOSIS — C4440 Unspecified malignant neoplasm of skin of scalp and neck: Secondary | ICD-10-CM | POA: Diagnosis not present

## 2019-04-03 DIAGNOSIS — X58XXXA Exposure to other specified factors, initial encounter: Secondary | ICD-10-CM | POA: Insufficient documentation

## 2019-04-03 DIAGNOSIS — C4442 Squamous cell carcinoma of skin of scalp and neck: Secondary | ICD-10-CM | POA: Diagnosis not present

## 2019-04-03 DIAGNOSIS — Z923 Personal history of irradiation: Secondary | ICD-10-CM | POA: Insufficient documentation

## 2019-04-03 HISTORY — PX: APPLICATION OF A-CELL OF EXTREMITY: SHX6303

## 2019-04-03 SURGERY — APPLICATION OF A-CELL OF EXTREMITY
Anesthesia: General | Site: Scalp

## 2019-04-03 MED ORDER — FENTANYL CITRATE (PF) 100 MCG/2ML IJ SOLN
INTRAMUSCULAR | Status: DC | PRN
  Administered 2019-04-03 (×2): 25 ug via INTRAVENOUS

## 2019-04-03 MED ORDER — LIDOCAINE 2% (20 MG/ML) 5 ML SYRINGE
INTRAMUSCULAR | Status: DC | PRN
  Administered 2019-04-03: 40 mg via INTRAVENOUS

## 2019-04-03 MED ORDER — ACETAMINOPHEN 500 MG PO TABS
1000.0000 mg | ORAL_TABLET | Freq: Once | ORAL | Status: AC
  Administered 2019-04-03: 1000 mg via ORAL

## 2019-04-03 MED ORDER — CLINDAMYCIN PHOSPHATE 900 MG/50ML IV SOLN
900.0000 mg | INTRAVENOUS | Status: AC
  Administered 2019-04-03: 13:00:00 900 mg via INTRAVENOUS

## 2019-04-03 MED ORDER — ACETAMINOPHEN 500 MG PO TABS
ORAL_TABLET | ORAL | Status: AC
  Filled 2019-04-03: qty 2

## 2019-04-03 MED ORDER — FENTANYL CITRATE (PF) 100 MCG/2ML IJ SOLN
INTRAMUSCULAR | Status: AC
  Filled 2019-04-03: qty 2

## 2019-04-03 MED ORDER — ONDANSETRON HCL 4 MG/2ML IJ SOLN: 4.0000 mg | Freq: Once | INTRAMUSCULAR | Status: DC | PRN

## 2019-04-03 MED ORDER — FENTANYL CITRATE (PF) 100 MCG/2ML IJ SOLN: 25.0000 ug | INTRAMUSCULAR | Status: DC | PRN

## 2019-04-03 MED ORDER — LIDOCAINE-EPINEPHRINE 0.5 %-1:200000 IJ SOLN
INTRAMUSCULAR | Status: DC | PRN
  Administered 2019-04-03: 20 mL

## 2019-04-03 MED ORDER — ONDANSETRON HCL 4 MG/2ML IJ SOLN
INTRAMUSCULAR | Status: DC | PRN
  Administered 2019-04-03: 4 mg via INTRAVENOUS

## 2019-04-03 MED ORDER — SODIUM CHLORIDE (PF) 0.9 % IJ SOLN
INTRAMUSCULAR | Status: AC
  Filled 2019-04-03: qty 10

## 2019-04-03 MED ORDER — LIDOCAINE HCL (PF) 1 % IJ SOLN
INTRAMUSCULAR | Status: AC
  Filled 2019-04-03: qty 5

## 2019-04-03 MED ORDER — LACTATED RINGERS IV SOLN: INTRAVENOUS | Status: DC | PRN

## 2019-04-03 MED ORDER — EPHEDRINE SULFATE-NACL 50-0.9 MG/10ML-% IV SOSY
PREFILLED_SYRINGE | INTRAVENOUS | Status: DC | PRN
  Administered 2019-04-03: 10 mg via INTRAVENOUS
  Administered 2019-04-03 (×2): 5 mg via INTRAVENOUS
  Administered 2019-04-03: 10 mg via INTRAVENOUS

## 2019-04-03 MED ORDER — LIDOCAINE-EPINEPHRINE 2 %-1:100000 IJ SOLN
INTRAMUSCULAR | Status: AC
  Filled 2019-04-03: qty 1

## 2019-04-03 MED ORDER — HYDROCODONE-ACETAMINOPHEN 5-325 MG PO TABS: 1.0000 | ORAL_TABLET | Freq: Four times a day (QID) | ORAL | 0 refills | Status: DC | PRN

## 2019-04-03 MED ORDER — PROPOFOL 10 MG/ML IV BOLUS
INTRAVENOUS | Status: AC
  Filled 2019-04-03: qty 20

## 2019-04-03 MED ORDER — LIDOCAINE-EPINEPHRINE 0.5 %-1:200000 IJ SOLN
INTRAMUSCULAR | Status: AC
  Filled 2019-04-03: qty 1

## 2019-04-03 MED ORDER — PROPOFOL 500 MG/50ML IV EMUL
INTRAVENOUS | Status: DC | PRN
  Administered 2019-04-03: 75 ug/kg/min via INTRAVENOUS

## 2019-04-03 MED ORDER — CLINDAMYCIN PHOSPHATE 900 MG/50ML IV SOLN
INTRAVENOUS | Status: AC
  Filled 2019-04-03: qty 50

## 2019-04-03 SURGICAL SUPPLY — 68 items
ADH SKN CLS APL DERMABOND .7 (GAUZE/BANDAGES/DRESSINGS)
BLADE CLIPPER SURG (BLADE) IMPLANT
BLADE SURG 15 STRL LF DISP TIS (BLADE) ×1 IMPLANT
BLADE SURG 15 STRL SS (BLADE) ×2
BNDG CONFORM 2 STRL LF (GAUZE/BANDAGES/DRESSINGS) IMPLANT
BNDG GAUZE ELAST 4 BULKY (GAUZE/BANDAGES/DRESSINGS) IMPLANT
CANISTER SUCT 1200ML W/VALVE (MISCELLANEOUS) IMPLANT
CLSR STERI-STRIP ANTIMIC 1/2X4 (GAUZE/BANDAGES/DRESSINGS) IMPLANT
COVER BACK TABLE 60X90IN (DRAPES) ×2 IMPLANT
COVER MAYO STAND STRL (DRAPES) ×2 IMPLANT
COVER WAND RF STERILE (DRAPES) IMPLANT
DERMABOND ADVANCED (GAUZE/BANDAGES/DRESSINGS)
DERMABOND ADVANCED .7 DNX12 (GAUZE/BANDAGES/DRESSINGS) IMPLANT
DRAPE HALF SHEET 70X43 (DRAPES) IMPLANT
DRAPE U-SHAPE 76X120 STRL (DRAPES) ×2 IMPLANT
DRSG ADAPTIC 3X8 NADH LF (GAUZE/BANDAGES/DRESSINGS) IMPLANT
DRSG EMULSION OIL 3X3 NADH (GAUZE/BANDAGES/DRESSINGS) IMPLANT
ELECT COATED BLADE 2.86 ST (ELECTRODE) IMPLANT
ELECT NDL BLADE 2-5/6 (NEEDLE) IMPLANT
ELECT NEEDLE BLADE 2-5/6 (NEEDLE) IMPLANT
ELECT REM PT RETURN 9FT ADLT (ELECTROSURGICAL)
ELECTRODE REM PT RTRN 9FT ADLT (ELECTROSURGICAL) IMPLANT
GAUZE SPONGE 4X4 12PLY STRL LF (GAUZE/BANDAGES/DRESSINGS) IMPLANT
GAUZE XEROFORM 1X8 LF (GAUZE/BANDAGES/DRESSINGS) IMPLANT
GAUZE XEROFORM 5X9 LF (GAUZE/BANDAGES/DRESSINGS) IMPLANT
GLOVE BIOGEL M STRL SZ7.5 (GLOVE) ×2 IMPLANT
GLOVE BIOGEL PI IND STRL 6.5 (GLOVE) IMPLANT
GLOVE BIOGEL PI IND STRL 7.0 (GLOVE) IMPLANT
GLOVE BIOGEL PI IND STRL 8 (GLOVE) ×1 IMPLANT
GLOVE BIOGEL PI INDICATOR 6.5 (GLOVE) ×1
GLOVE BIOGEL PI INDICATOR 7.0 (GLOVE) ×1
GLOVE BIOGEL PI INDICATOR 8 (GLOVE) ×1
GLOVE SURG SS PI 7.0 STRL IVOR (GLOVE) ×1 IMPLANT
GOWN STRL REUS W/ TWL LRG LVL3 (GOWN DISPOSABLE) ×2 IMPLANT
GOWN STRL REUS W/TWL LRG LVL3 (GOWN DISPOSABLE) ×4
MATRIX TISSUE MESHED 4X5 (Tissue) ×1 IMPLANT
NDL HYPO 30GX1 BEV (NEEDLE) IMPLANT
NDL PRECISIONGLIDE 27X1.5 (NEEDLE) IMPLANT
NEEDLE HYPO 30GX1 BEV (NEEDLE) IMPLANT
NEEDLE PRECISIONGLIDE 27X1.5 (NEEDLE) IMPLANT
NS IRRIG 1000ML POUR BTL (IV SOLUTION) IMPLANT
PACK BASIN DAY SURGERY FS (CUSTOM PROCEDURE TRAY) ×2 IMPLANT
PENCIL SMOKE EVACUATOR (MISCELLANEOUS) ×2 IMPLANT
SPONGE GAUZE 2X2 8PLY STRL LF (GAUZE/BANDAGES/DRESSINGS) IMPLANT
STRIP CLOSURE SKIN 1/2X4 (GAUZE/BANDAGES/DRESSINGS) IMPLANT
STRIP SUTURE WOUND CLOSURE 1/2 (MISCELLANEOUS) IMPLANT
SUCTION FRAZIER HANDLE 10FR (MISCELLANEOUS)
SUCTION TUBE FRAZIER 10FR DISP (MISCELLANEOUS) IMPLANT
SURGILUBE 2OZ TUBE FLIPTOP (MISCELLANEOUS) ×2 IMPLANT
SUT CHROMIC 4 0 P 3 18 (SUTURE) IMPLANT
SUT CHROMIC 5 0 P 3 (SUTURE) IMPLANT
SUT ETHILON 4 0 P 3 18 (SUTURE) IMPLANT
SUT ETHILON 4 0 PS 2 18 (SUTURE) IMPLANT
SUT ETHILON 5 0 P 3 18 (SUTURE)
SUT MNCRL AB 3-0 PS2 18 (SUTURE) IMPLANT
SUT MNCRL AB 4-0 PS2 18 (SUTURE) IMPLANT
SUT MON AB 5-0 P3 18 (SUTURE) IMPLANT
SUT NYLON ETHILON 5-0 P-3 1X18 (SUTURE) IMPLANT
SUT PLAIN 5 0 P 3 18 (SUTURE) IMPLANT
SUT PROLENE 4 0 PS 2 18 (SUTURE) IMPLANT
SUT VIC AB 5-0 P-3 18X BRD (SUTURE) IMPLANT
SUT VIC AB 5-0 P3 18 (SUTURE)
SUT VICRYL 4-0 PS2 18IN ABS (SUTURE) IMPLANT
SYR BULB 3OZ (MISCELLANEOUS) IMPLANT
SYR CONTROL 10ML LL (SYRINGE) ×2 IMPLANT
TOWEL GREEN STERILE FF (TOWEL DISPOSABLE) ×2 IMPLANT
TRAY DSU PREP LF (CUSTOM PROCEDURE TRAY) IMPLANT
TUBE CONNECTING 20X1/4 (TUBING) IMPLANT

## 2019-04-03 NOTE — Interval H&P Note (Signed)
History and Physical Interval Note:  04/03/2019 11:42 AM  Crystal Vazquez  has presented today for surgery, with the diagnosis of skin cancer of scalp.  The various methods of treatment have been discussed with the patient and family. After consideration of risks, benefits and other options for treatment, the patient has consented to  Procedure(s) with comments: APPLICATION OF Integra Bilayer to scalp (N/A) - 45 min as a surgical intervention.  The patient's history has been reviewed, patient examined, no change in status, stable for surgery.  I have reviewed the patient's chart and labs.  Questions were answered to the patient's satisfaction.     [name]

## 2019-04-03 NOTE — Discharge Instructions (Signed)
Activity Avoid strenuous activity.   Sleep with head elevated to reduce swelling  Diet: Regular  Wound Care: Keep dressing clean & dry.  No need to change bandage, but the white gauze can be changed if soiled.  The yellow deeper dressing is sewn in place and will be removed by Dr. Claudia Desanctis in the clinic.  Special Instructions: Call doctor if any unusual problems occur such as pain, excessive bleeding, unrelieved nausea/vomiting, fever &/or chills There is a prescription for pain medication sent to the pharmacy that you can fill if needed.  Otherwise it can be left unfilled.  Would recommend trying tylenol and ibuprofen first and use stronger pain medication only if needed.  Follow-up appointment: Scheduled for next week.    Post Anesthesia Home Care Instructions  Activity: Get plenty of rest for the remainder of the day. A responsible individual must stay with you for 24 hours following the procedure.  For the next 24 hours, DO NOT: -Drive a car -Paediatric nurse -Drink alcoholic beverages -Take any medication unless instructed by your physician -Make any legal decisions or sign important papers.  Meals: Start with liquid foods such as gelatin or soup. Progress to regular foods as tolerated. Avoid greasy, spicy, heavy foods. If nausea and/or vomiting occur, drink only clear liquids until the nausea and/or vomiting subsides. Call your physician if vomiting continues.  Special Instructions/Symptoms: Your throat may feel dry or sore from the anesthesia or the breathing tube placed in your throat during surgery. If this causes discomfort, gargle with warm salt water. The discomfort should disappear within 24 hours.  If you had a scopolamine patch placed behind your ear for the management of post- operative nausea and/or vomiting:  1. The medication in the patch is effective for 72 hours, after which it should be removed.  Wrap patch in a tissue and discard in the trash. Wash hands  thoroughly with soap and water. 2. You may remove the patch earlier than 72 hours if you experience unpleasant side effects which may include dry mouth, dizziness or visual disturbances. 3. Avoid touching the patch. Wash your hands with soap and water after contact with the patch.

## 2019-04-03 NOTE — Anesthesia Postprocedure Evaluation (Signed)
Anesthesia Post Note  Patient: Crystal Vazquez  Procedure(s) Performed: APPLICATION OF Integra Bilayer to scalp (N/A Scalp)     Patient location during evaluation: PACU Anesthesia Type: MAC Level of consciousness: awake and alert Pain management: pain level controlled Vital Signs Assessment: post-procedure vital signs reviewed and stable Respiratory status: spontaneous breathing, nonlabored ventilation and respiratory function stable Cardiovascular status: blood pressure returned to baseline and stable Postop Assessment: no apparent nausea or vomiting Anesthetic complications: no    Last Vitals:  Vitals:   04/03/19 1400 04/03/19 1415  BP: (!) 113/59 124/69  Pulse: 63 66  Resp: 17 (!) 22  Temp:    SpO2: 97% 97%    Last Pain:  Vitals:   04/03/19 1415  TempSrc:   PainSc: [place]

## 2019-04-03 NOTE — Brief Op Note (Signed)
04/03/2019  1:42 PM  PATIENT:  Aleli L Leitch  86 y.o. female  PRE-OPERATIVE DIAGNOSIS:  skin cancer of scalp  POST-OPERATIVE DIAGNOSIS:  skin cancer of scalp  PROCEDURE:  Procedure(s) with comments: APPLICATION OF Integra Bilayer to scalp (N/A) - 45 min  SURGEON:  Surgeon(s) and Role:    * Aniel Hubble, Steffanie Dunn, MD - Primary  PHYSICIAN ASSISTANT: None  ASSISTANTS: none   ANESTHESIA:   IV sedation  EBL:  2 mL   BLOOD ADMINISTERED:none  DRAINS: none   LOCAL MEDICATIONS USED:  MARCAINE     SPECIMEN:  No Specimen  DISPOSITION OF SPECIMEN:  N/A  COUNTS:  YES  TOURNIQUET:  * No tourniquets in log *  DICTATION: .Dragon Dictation  PLAN OF CARE: Discharge to home after PACU  PATIENT DISPOSITION:  PACU - hemodynamically stable.   Delay start of Pharmacological VTE agent (>24hrs) due to surgical blood loss or risk of bleeding: not applicable

## 2019-04-03 NOTE — Anesthesia Preprocedure Evaluation (Addendum)
Anesthesia Evaluation  Patient identified by MRN, date of birth, ID band Patient awake    Reviewed: Allergy & Precautions, NPO status , Patient's Chart, lab work & pertinent test results  Airway Mallampati: II  TM Distance: >3 FB Neck ROM: Full    Dental no notable dental hx. (+) Edentulous Upper, Partial Lower, Poor Dentition, Dental Advisory Given,    Pulmonary neg pulmonary ROS,    Pulmonary exam normal breath sounds clear to auscultation       Cardiovascular hypertension, Pt. on medications Normal cardiovascular exam Rhythm:Regular Rate:Normal     Neuro/Psych RLS, peripheral neuropathy B/L feet negative psych ROS   GI/Hepatic Neg liver ROS, GERD  Medicated and Controlled,diverticulosis   Endo/Other  negative endocrine ROS  Renal/GU negative Renal ROS Bladder dysfunction  Urinary incontinence    Musculoskeletal  (+) Arthritis , Osteoarthritis,    Abdominal Normal abdominal exam  (+)   Peds negative pediatric ROS (+)  Hematology negative hematology ROS (+)   Anesthesia Other Findings Scalp skin ca  HLD  Reproductive/Obstetrics negative OB ROS                            Anesthesia Physical  Anesthesia Plan  ASA: II  Anesthesia Plan: MAC   Post-op Pain Management:    Induction:   PONV Risk Score and Plan: 2 and Treatment may vary due to age or medical condition, Propofol infusion and TIVA  Airway Management Planned: Natural Airway and Nasal Cannula  Additional Equipment: None  Intra-op Plan:   Post-operative Plan:   Informed Consent: I have reviewed the patients History and Physical, chart, labs and discussed the procedure including the risks, benefits and alternatives for the proposed anesthesia with the patient or authorized representative who has indicated his/her understanding and acceptance.       Plan Discussed with: CRNA  Anesthesia Plan Comments: (MAC with  local)       Anesthesia Quick Evaluation

## 2019-04-03 NOTE — Transfer of Care (Signed)
Immediate Anesthesia Transfer of Care Note  Patient: Crystal Vazquez  Procedure(s) Performed: APPLICATION OF Integra Bilayer to scalp (N/A Scalp)  Patient Location: PACU  Anesthesia Type:MAC  Level of Consciousness: awake, alert  and oriented  Airway & Oxygen Therapy: Patient Spontanous Breathing  Post-op Assessment: Report given to RN and Post -op Vital signs reviewed and stable  Post vital signs: Reviewed and stable  Last Vitals:  Vitals Value Taken Time  BP 111/60 04/03/19 1345  Temp    Pulse 66 04/03/19 1349  Resp 15 04/03/19 1349  SpO2 97 % 04/03/19 1349  Vitals shown include unvalidated device data.  Last Pain:  Vitals:   04/03/19 1130  TempSrc: Tympanic      Patients Stated Pain Goal: 2 (16/10/96 0454)  Complications: No apparent anesthesia complications

## 2019-04-03 NOTE — Op Note (Signed)
Operative Note   DATE OF OPERATION: 04/03/2019  SURGICAL DEPARTMENT: Plastic Surgery  PREOPERATIVE DIAGNOSES:  Full-thickness scalp wound 8 x 8 cm  POSTOPERATIVE DIAGNOSES:  same  PROCEDURE: 1.  Surgical preparation for skin graft of scalp wound 8 x 8 cm 2.  Placement of Integra meshed bilayer to scalp wound 8 x 8 cm  SURGEON: Talmadge Coventry, MD  ASSISTANT: None.  ANESTHESIA:  General.   COMPLICATIONS: None.   INDICATIONS FOR PROCEDURE:  The patient, Crystal Vazquez is a 86 y.o. female born on 1926-03-18, is here for treatment of large full-thickness scalp wound with exposed calvarium MRN: [id number]  CONSENT:  Informed consent was obtained directly from the patient. Risks, benefits and alternatives were fully discussed. Specific risks including but not limited to bleeding, infection, hematoma, seroma, scarring, pain, contracture, asymmetry, wound healing problems, and need for further surgery were all discussed. The patient did have an ample opportunity to have questions answered to satisfaction.   DESCRIPTION OF PROCEDURE:  The patient was taken to the operating room. SCDs were placed and antibiotics were given.  Sedation anesthesia was administered.  The patient's operative site was prepped and draped in a sterile fashion. A time out was performed and all information was confirmed to be correct.  There is anesthetized with half percent Marcaine with epinephrine circumferentially.  20 cc was used.  The wound was then debrided bluntly with a moist Ray-Tec to remove any colonized granulation tissue and superficial devitalized tissue.  Pickups and scissors were then used to debride any other nonviable tissue.  The meshed bilayer was then brought onto the field and soaked for 2 to 3 minutes.  There was then brought out and sutured to the wound with a silicone side up.  It was secured with a combination of 4-0 chromic and 4-0 nylon sutures.  A bolster was then fashioned with Xeroform  foam and 3-0 nylon.  The patient tolerated the procedure well.  There were no complications. The patient was allowed to wake from anesthesia, extubated and taken to the recovery room in satisfactory condition.

## 2019-04-04 ENCOUNTER — Encounter: Payer: Self-pay | Admitting: *Deleted

## 2019-04-12 ENCOUNTER — Encounter: Payer: Self-pay | Admitting: Plastic Surgery

## 2019-04-12 ENCOUNTER — Other Ambulatory Visit: Payer: Self-pay

## 2019-04-12 ENCOUNTER — Ambulatory Visit (INDEPENDENT_AMBULATORY_CARE_PROVIDER_SITE_OTHER): Payer: Medicare HMO | Admitting: Plastic Surgery

## 2019-04-12 VITALS — BP 167/93 | HR 75 | Temp 97.5°F | Ht 64.0 in | Wt 148.2 lb

## 2019-04-12 DIAGNOSIS — C4442 Squamous cell carcinoma of skin of scalp and neck: Secondary | ICD-10-CM

## 2019-04-12 NOTE — Progress Notes (Signed)
Patient is here postop from placement of Integra on her scalp.  This was for full-thickness defect with a large area of exposed calvarium.  She done well since the surgery and is left the bolster in place.  She did have a reaction to the hydrocodone and is stopped taking that.  The bolster was removed today today revealing satisfactory placement of the Integra.  The silicone layer was left in place.  I have asked her to protect this with Xeroform and gauze dressing for the next few weeks.  I will plan to see her again at that time and reevaluate.

## 2019-04-13 ENCOUNTER — Telehealth: Payer: Self-pay | Admitting: *Deleted

## 2019-04-13 DIAGNOSIS — L988 Other specified disorders of the skin and subcutaneous tissue: Secondary | ICD-10-CM | POA: Diagnosis not present

## 2019-04-13 NOTE — Telephone Encounter (Addendum)
Faxed order to [facility] for supplies for the patient.  Confirmation received.//AB/CMA 

## 2019-04-26 DIAGNOSIS — Z6826 Body mass index (BMI) 26.0-26.9, adult: Secondary | ICD-10-CM | POA: Diagnosis not present

## 2019-04-26 DIAGNOSIS — R21 Rash and other nonspecific skin eruption: Secondary | ICD-10-CM | POA: Diagnosis not present

## 2019-04-26 DIAGNOSIS — I10 Essential (primary) hypertension: Secondary | ICD-10-CM | POA: Diagnosis not present

## 2019-05-03 ENCOUNTER — Encounter: Payer: Self-pay | Admitting: Plastic Surgery

## 2019-05-03 ENCOUNTER — Other Ambulatory Visit: Payer: Self-pay

## 2019-05-03 ENCOUNTER — Ambulatory Visit: Payer: Medicare HMO | Admitting: Plastic Surgery

## 2019-05-03 ENCOUNTER — Telehealth: Payer: Self-pay

## 2019-05-03 VITALS — BP 150/83 | HR 74 | Temp 97.1°F | Ht 64.0 in | Wt 147.0 lb

## 2019-05-03 DIAGNOSIS — C4442 Squamous cell carcinoma of skin of scalp and neck: Secondary | ICD-10-CM

## 2019-05-03 NOTE — Telephone Encounter (Signed)
Orders for wound supplies faxed to [facility]: Xeroform/4x4 gauze for daily dressing changes

## 2019-05-03 NOTE — Progress Notes (Signed)
   Referring Provider Cyndi Bender, PA-C [place],  [place] [address]   CC: No chief complaint on file. Scalp wound  Crystal Vazquez is an 87 y.o. female.  HPI: Patient is here postop from debridement of a scalp wound with placement of Integra bilayer.  She is doing well and feels like she is tolerating the wound care well.  Review of Systems General: Denies fevers, chills  Physical Exam Vitals with BMI 05/03/2019 04/12/2019 04/03/2019  Height 5\' 4"  5\' 4"  -  Weight 147 lbs 148 lbs 3 oz -  BMI 0000000 [id number] -  Systolic Q000111Q A999333 Q000111Q  Diastolic 83 93 69  Pulse 74 75 65    General:  No acute distress,  Alert and oriented, Non-Toxic, Normal speech and affect Examination of the scalp wound shows progressive granulation tissue beneath the silicone layer of the Integra.  There is no undrained fluid or any signs of purulence.  She does have mild surrounding tenderness of the scalp which is not surprising.  Assessment/Plan Patient presents with progressive healing of a scalp wound that had a large area of exposed calvarium.  It appears that the granulation tissue is progressing under the silicone layer of the Integra so I am going to leave this in place for now.  We will continue with dressing changes over the silicone layer as is being done and I will see her again in 3 weeks.  [name] 05/03/2019, 2:34 PM

## 2019-05-10 DIAGNOSIS — H401111 Primary open-angle glaucoma, right eye, mild stage: Secondary | ICD-10-CM | POA: Diagnosis not present

## 2019-05-10 DIAGNOSIS — H04123 Dry eye syndrome of bilateral lacrimal glands: Secondary | ICD-10-CM | POA: Diagnosis not present

## 2019-05-10 DIAGNOSIS — H18593 Other hereditary corneal dystrophies, bilateral: Secondary | ICD-10-CM | POA: Diagnosis not present

## 2019-05-10 DIAGNOSIS — H401122 Primary open-angle glaucoma, left eye, moderate stage: Secondary | ICD-10-CM | POA: Diagnosis not present

## 2019-05-24 ENCOUNTER — Other Ambulatory Visit: Payer: Self-pay

## 2019-05-24 ENCOUNTER — Ambulatory Visit: Payer: Medicare HMO | Admitting: Surgical

## 2019-05-24 ENCOUNTER — Encounter: Payer: Self-pay | Admitting: Surgical

## 2019-05-24 VITALS — BP 119/69 | HR 61 | Temp 97.3°F | Ht 64.0 in

## 2019-05-24 DIAGNOSIS — C4442 Squamous cell carcinoma of skin of scalp and neck: Secondary | ICD-10-CM | POA: Diagnosis not present

## 2019-05-24 NOTE — Progress Notes (Signed)
   Subjective:     Patient ID: Crystal Vazquez, female    DOB: [birth year], 86 y.o.   MRN: [id number]  Chief Complaint  Patient presents with  . Follow-up    3 weeks for squamous cell carcinoma scalp/neck    HPI: The patient is a 86 y.o. female here for follow-up after debridement and placement of Integra bilayer to scalp wound status post Mohs excision of large squamous cell on 04/03/2019 with Dr. Silverio Lay pace.  She is doing well today.  She reports that she has been changing her dressing every other day with Xeroform followed by 4 x 4 gauze and ABD pads - with assistance from husband.  They are using a little bit of tape to hold the ABD in place and then she is using a hat to hold it.  She reports that with every other day dressing changes she has noticed that the xeroform dries out and is difficult to remove.  She has not needed anything for pain control, she reports that most the time she feels fine but intermittently she is having some pain, but does not take any medications for it.  Review of Systems  Constitutional: Negative.   Gastrointestinal: Negative for nausea and vomiting.     Objective:   Vital Signs BP 119/69 (BP Location: Left Arm, Patient Position: Sitting, Cuff Size: Large)   Pulse 61   Temp (!) 97.3 F (36.3 C) (Temporal)   Ht 5\' 4"  (1.626 m)   SpO2 97%   BMI 25.23 kg/m  Vital Signs and Nursing Note Reviewed Physical Exam  Constitutional: She is oriented to person, place, and time.  HENT:  Head:    Pulmonary/Chest: Effort normal.  Neurological: She is alert and oriented to person, place, and time.  Skin: Skin is warm and dry. No erythema.  Psychiatric: Mood and affect normal.     Assessment/Plan:     ICD-10-CM   1. SCC (squamous cell carcinoma), scalp/neck  C44.42     Recommend changing to daily dressing changes.  Unsure of the reason for her new wound anteriorly and posteriorly, seem to be associated with the Xeroform drying out and removing  epithelium with removal of Xeroform due to patient info. They are going to try daily changes for the next 2 weeks.  No sign of any infection  Silicone layer removed today from the Integra bilayer.  She has good granulation tissue.  No exposed bone.  Hopeful that she will begin epithelializing at the edges in the next few weeks.  Recommend healthy diet with plenty of water.  She said she has been doing this.  Call with any questions or concerns.    Carola Rhine Rayli Wiederhold, PA-C 05/24/2019, 1:49 PM

## 2019-05-26 DIAGNOSIS — L988 Other specified disorders of the skin and subcutaneous tissue: Secondary | ICD-10-CM | POA: Diagnosis not present

## 2019-06-07 ENCOUNTER — Other Ambulatory Visit: Payer: Self-pay

## 2019-06-07 ENCOUNTER — Encounter: Payer: Self-pay | Admitting: Surgical

## 2019-06-07 ENCOUNTER — Ambulatory Visit (INDEPENDENT_AMBULATORY_CARE_PROVIDER_SITE_OTHER): Payer: Medicare HMO | Admitting: Surgical

## 2019-06-07 VITALS — BP 146/80 | HR 68 | Temp 97.3°F | Ht 64.0 in | Wt 146.6 lb

## 2019-06-07 DIAGNOSIS — C4442 Squamous cell carcinoma of skin of scalp and neck: Secondary | ICD-10-CM

## 2019-06-07 NOTE — Progress Notes (Signed)
   Subjective:     Patient ID: Crystal Vazquez, female    DOB: Jun 04, 1926, 86 y.o.   MRN: [id number]  Chief Complaint  Patient presents with  . Follow-up    2 weeks for squamous cell carcinoma-scalp/neck    HPI: The patient is a 86 y.o. female here for follow-up after debridement and placement of Integra bilayer to scalp wound status post Mohs excision of large squamous cell carcinoma on 04/03/2019 with Dr. Claudia Desanctis.  She was last seen in this office 2 weeks ago.  At that time she was doing well.  She is here today with her friend.  She is doing really well today.  They have been doing dressing changes every day.  They have been using a moist washcloth to place over Xeroform prior to removal.  Decrease any risk of tearing additional skin.  She reports that she has a little bit of pain intermittently along the right lateral aspect of her scalp.  This is easily controlled with Tylenol.  She has no other complaints.  Review of Systems  Constitutional: Negative.   Respiratory: Negative for shortness of breath.   Cardiovascular: Negative for chest pain.  Skin: Positive for color change and wound.   Objective:   Vital Signs BP (!) 146/80 (BP Location: Left Arm, Patient Position: Sitting, Cuff Size: Large)   Pulse 68   Temp (!) 97.3 F (36.3 C) (Temporal)   Ht 5\' 4"  (1.626 m)   Wt 146 lb 9.6 oz (66.5 kg)   SpO2 98%   BMI 25.16 kg/m  Vital Signs and Nursing Note Reviewed  Physical Exam  Constitutional: She is oriented to person, place, and time.  Thin elderly woman  HENT:  5.5 x 8 cm scalp wound with Integra bilayer in place, good granulation tissue noted.  She hasnew epithelium noted at the posterior aspect of her scalp wound.  No periwound erythema.  Minimal tenderness.  She has some Xeroform exudate noted at the wound edges.  Pulmonary/Chest: Effort normal.  Neurological: She is alert and oriented to person, place, and time.  Skin: Skin is warm and dry.  Psychiatric: Mood and  affect normal.      Assessment/Plan:     ICD-10-CM   1. SCC (squamous cell carcinoma), scalp/neck  C44.42     [name] is doing well.  Her wound has decreased in size, has good granular base and new epithelium at the edges.  I removed some of the buildup of Xeroform at the periphery of her wound using sterile saline and gauze.  She tolerated this fine.  They have been doing dressing changes daily without any issue. There is no sign of any infection.  Recommend continuing with Xeroform gauze daily, follow-up in 3 weeks for reevaluation.   Carola Rhine Ghazal Pevey, PA-C 06/07/2019, 2:34 PM

## 2019-06-27 ENCOUNTER — Other Ambulatory Visit: Payer: Self-pay

## 2019-06-27 ENCOUNTER — Ambulatory Visit: Payer: Medicare HMO | Admitting: Surgical

## 2019-06-27 ENCOUNTER — Encounter: Payer: Self-pay | Admitting: Surgical

## 2019-06-27 VITALS — BP 142/77 | HR 58 | Temp 97.3°F | Ht 64.0 in | Wt 147.0 lb

## 2019-06-27 DIAGNOSIS — C4442 Squamous cell carcinoma of skin of scalp and neck: Secondary | ICD-10-CM

## 2019-06-27 NOTE — Progress Notes (Signed)
Patient is a 86 year old female here for follow-up after debridement and placement of Integra bilayer to scalp wound status post Mohs excision of large squamous cell carcinoma on 04/03/2019 with Dr. Claudia Desanctis.  She is here with her daughter today.  I last saw her on 06/07/2019.  She has been doing Xeroform dressing changes daily with assistance of her husband.  She reports that pain has been minimal and she has not had to take anything for pain.  She does however take Tylenol for back pain regularly and this is helpful.  She has no other complaints.  Today her scalp wound is 5 x 7.5 cm with good granular base.  She does have some sloughing epidermis along the left anterior scalp, this appears to be associated with some increased moisture.   She has no other complaints.  She is making good progress and understands that this may be a slow healing wound due to the size and her age.  ROS: Negative for fevers, chills.  Positive for color change and wound  BP (!) 142/77 (BP Location: Left Arm, Patient Position: Sitting, Cuff Size: Normal)   Pulse (!) 58   Temp (!) 97.3 F (36.3 C) (Temporal)   Ht 5\' 4"  (1.626 m)   Wt 147 lb (66.7 kg)   SpO2 98%   BMI 25.23 kg/m   5 x 7.5 cm scalp wound with Integra bilayer in place, good granulation tissue noted.  The wound is slowly epithelializing at the edges.  Left anterior area of scalp with sloughing epidermis - this is not new. Not erythematous at the edges, minimal tenderness. No foul odor. No drainage noted. Xeroform exudate noted.  Plan:  Continue with daily xeroform dressing changes followed by gauze and ABD. Avoid xeroform on anterior scalp skin to prevent excess moisture and skin sloughing.  No sign of infection. No pain. Patient is pleased with her progress.  Follow up in 3 weeks with Dr. Claudia Desanctis

## 2019-07-06 DIAGNOSIS — L988 Other specified disorders of the skin and subcutaneous tissue: Secondary | ICD-10-CM | POA: Diagnosis not present

## 2019-07-20 ENCOUNTER — Ambulatory Visit (INDEPENDENT_AMBULATORY_CARE_PROVIDER_SITE_OTHER): Payer: Medicare HMO | Admitting: Plastic Surgery

## 2019-07-20 ENCOUNTER — Ambulatory Visit: Payer: Medicare HMO | Admitting: Plastic Surgery

## 2019-07-20 ENCOUNTER — Other Ambulatory Visit: Payer: Self-pay

## 2019-07-20 DIAGNOSIS — C4442 Squamous cell carcinoma of skin of scalp and neck: Secondary | ICD-10-CM | POA: Diagnosis not present

## 2019-07-20 DIAGNOSIS — C44329 Squamous cell carcinoma of skin of other parts of face: Secondary | ICD-10-CM | POA: Diagnosis not present

## 2019-07-20 DIAGNOSIS — D485 Neoplasm of uncertain behavior of skin: Secondary | ICD-10-CM | POA: Diagnosis not present

## 2019-07-20 NOTE — H&P (View-Only) (Signed)
   Referring Provider Cyndi Bender, PA-C [place],  [place] [address]   CC: No chief complaint on file. Scalp wound  Crystal Vazquez is an 86 y.o. female.  HPI: Patient is here after another Mohs excision on her scalp.  This was for squamous cell which she had before.  Negative margins were obtained and she is here to discuss her reconstructive options.  Review of Systems General: Denies fevers or chills  Physical Exam Vitals with BMI 06/27/2019 06/07/2019 05/24/2019  Height _0  _1  _2   Weight 147 lbs 146 lbs 10 oz (No Data)  BMI 01.77 93.90 -  Systolic 300 923 300  Diastolic 77 80 69  Pulse 58 68 61    General:  No acute distress,  Alert and oriented, Non-Toxic, Normal speech and affect Examination of the scalp shows approximately 8 x 5 cm circular defect with exposed calvarium.  The Integra that I previously placed over the anterior/vertex wound has integrated nicely and is providing stable coverage over the bone.  It is still epithelializing gradually.  Assessment/Plan I recommended another application of Integra over the new posterior wound.  We discussed the risks include bleeding, infection, damage to structures, need for additional procedures.  We will plan to do this in the surgery center under MAC anesthesia like we did last time.  I explained she would need a bolster for a week afterwards followed by Xeroform dressings.  She is familiar with the protocol and its exactly the same as what we did before but just a small area.  We will plan to do this in the next week or so.  [name] 07/20/2019, 2:13 PM

## 2019-07-20 NOTE — Progress Notes (Signed)
   Referring Provider Cyndi Bender, PA-C [name],  [name] [address]   CC: No chief complaint on file. Scalp wound  Crystal Vazquez is an 86 y.o. female.  HPI: Patient is here after another Mohs excision on her scalp.  This was for squamous cell which she had before.  Negative margins were obtained and she is here to discuss her reconstructive options.  Review of Systems General: Denies fevers or chills  Physical Exam Vitals with BMI 06/27/2019 06/07/2019 05/24/2019  Height _0  _1  _2   Weight 147 lbs 146 lbs 10 oz (No Data)  BMI 96.29 52.84 -  Systolic 132 440 102  Diastolic 77 80 69  Pulse 58 68 61    General:  No acute distress,  Alert and oriented, Non-Toxic, Normal speech and affect Examination of the scalp shows approximately 8 x 5 cm circular defect with exposed calvarium.  The Integra that I previously placed over the anterior/vertex wound has integrated nicely and is providing stable coverage over the bone.  It is still epithelializing gradually.  Assessment/Plan I recommended another application of Integra over the new posterior wound.  We discussed the risks include bleeding, infection, damage to structures, need for additional procedures.  We will plan to do this in the surgery center under MAC anesthesia like we did last time.  I explained she would need a bolster for a week afterwards followed by Xeroform dressings.  She is familiar with the protocol and its exactly the same as what we did before but just a small area.  We will plan to do this in the next week or so.  [name] 07/20/2019, 2:13 PM

## 2019-08-04 ENCOUNTER — Encounter (HOSPITAL_BASED_OUTPATIENT_CLINIC_OR_DEPARTMENT_OTHER): Payer: Self-pay | Admitting: Plastic Surgery

## 2019-08-04 ENCOUNTER — Other Ambulatory Visit: Payer: Self-pay

## 2019-08-08 DIAGNOSIS — K2970 Gastritis, unspecified, without bleeding: Secondary | ICD-10-CM | POA: Diagnosis not present

## 2019-08-08 DIAGNOSIS — I10 Essential (primary) hypertension: Secondary | ICD-10-CM | POA: Diagnosis not present

## 2019-08-08 DIAGNOSIS — L988 Other specified disorders of the skin and subcutaneous tissue: Secondary | ICD-10-CM | POA: Diagnosis not present

## 2019-08-08 DIAGNOSIS — Z6826 Body mass index (BMI) 26.0-26.9, adult: Secondary | ICD-10-CM | POA: Diagnosis not present

## 2019-08-08 DIAGNOSIS — M545 Low back pain: Secondary | ICD-10-CM | POA: Diagnosis not present

## 2019-08-08 DIAGNOSIS — G4700 Insomnia, unspecified: Secondary | ICD-10-CM | POA: Diagnosis not present

## 2019-08-08 DIAGNOSIS — G629 Polyneuropathy, unspecified: Secondary | ICD-10-CM | POA: Diagnosis not present

## 2019-08-08 DIAGNOSIS — G2581 Restless legs syndrome: Secondary | ICD-10-CM | POA: Diagnosis not present

## 2019-08-08 DIAGNOSIS — E7800 Pure hypercholesterolemia, unspecified: Secondary | ICD-10-CM | POA: Diagnosis not present

## 2019-08-08 DIAGNOSIS — K219 Gastro-esophageal reflux disease without esophagitis: Secondary | ICD-10-CM | POA: Diagnosis not present

## 2019-08-10 DIAGNOSIS — C44329 Squamous cell carcinoma of skin of other parts of face: Secondary | ICD-10-CM | POA: Diagnosis not present

## 2019-08-11 ENCOUNTER — Other Ambulatory Visit (HOSPITAL_COMMUNITY)
Admission: RE | Admit: 2019-08-11 | Discharge: 2019-08-11 | Disposition: A | Discharge status: Home / Self Care | Payer: Medicare HMO | Source: Ambulatory Visit | Attending: Plastic Surgery | Admitting: Plastic Surgery

## 2019-08-11 DIAGNOSIS — Z01812 Encounter for preprocedural laboratory examination: Secondary | ICD-10-CM | POA: Diagnosis not present

## 2019-08-11 DIAGNOSIS — Z20822 Contact with and (suspected) exposure to covid-19: Secondary | ICD-10-CM | POA: Insufficient documentation

## 2019-08-11 LAB — SARS CORONAVIRUS 2 (TAT 6-24 HRS): SARS Coronavirus 2: NEGATIVE

## 2019-08-14 NOTE — Anesthesia Preprocedure Evaluation (Addendum)
Anesthesia Evaluation  Patient identified by MRN, date of birth, ID band Patient awake    Reviewed: Allergy & Precautions, NPO status , Patient's Chart, lab work & pertinent test results  Airway Mallampati: II  TM Distance: >3 FB Neck ROM: Full    Dental no notable dental hx. (+) Edentulous Upper, Partial Lower,    Pulmonary neg pulmonary ROS,    Pulmonary exam normal breath sounds clear to auscultation       Cardiovascular hypertension, Pt. on medications Normal cardiovascular exam Rhythm:Regular Rate:Normal     Neuro/Psych negative neurological ROS  negative psych ROS   GI/Hepatic Neg liver ROS, GERD  Medicated,  Endo/Other  negative endocrine ROS  Renal/GU negative Renal ROS     Musculoskeletal  (+) Arthritis , Osteoarthritis,    Abdominal   Peds  Hematology negative hematology ROS (+)   Anesthesia Other Findings   Reproductive/Obstetrics                            Anesthesia Physical Anesthesia Plan  ASA: II  Anesthesia Plan: MAC   Post-op Pain Management:    Induction: Intravenous  PONV Risk Score and Plan: Propofol infusion and Treatment may vary due to age or medical condition  Airway Management Planned: Nasal Cannula and Natural Airway  Additional Equipment: None  Intra-op Plan:   Post-operative Plan:   Informed Consent: I have reviewed the patients History and Physical, chart, labs and discussed the procedure including the risks, benefits and alternatives for the proposed anesthesia with the patient or authorized representative who has indicated his/her understanding and acceptance.     Dental advisory given  Plan Discussed with: CRNA  Anesthesia Plan Comments: (All See list)       Anesthesia Quick Evaluation

## 2019-08-15 ENCOUNTER — Encounter (HOSPITAL_BASED_OUTPATIENT_CLINIC_OR_DEPARTMENT_OTHER)
Admission: RE | Disposition: A | Discharge status: Home / Self Care | Payer: Self-pay | Source: Home / Self Care | Attending: Plastic Surgery

## 2019-08-15 ENCOUNTER — Ambulatory Visit (HOSPITAL_BASED_OUTPATIENT_CLINIC_OR_DEPARTMENT_OTHER)
Admission: RE | Admit: 2019-08-15 | Discharge: 2019-08-15 | Disposition: A | Discharge status: Home / Self Care | Payer: Medicare HMO | Attending: Plastic Surgery | Admitting: Plastic Surgery

## 2019-08-15 ENCOUNTER — Encounter (HOSPITAL_BASED_OUTPATIENT_CLINIC_OR_DEPARTMENT_OTHER): Payer: Self-pay | Admitting: Plastic Surgery

## 2019-08-15 ENCOUNTER — Ambulatory Visit (HOSPITAL_BASED_OUTPATIENT_CLINIC_OR_DEPARTMENT_OTHER): Payer: Medicare HMO | Admitting: Anesthesiology

## 2019-08-15 DIAGNOSIS — Z428 Encounter for other plastic and reconstructive surgery following medical procedure or healed injury: Secondary | ICD-10-CM | POA: Insufficient documentation

## 2019-08-15 DIAGNOSIS — S0100XA Unspecified open wound of scalp, initial encounter: Secondary | ICD-10-CM | POA: Diagnosis present

## 2019-08-15 DIAGNOSIS — Z85828 Personal history of other malignant neoplasm of skin: Secondary | ICD-10-CM | POA: Diagnosis not present

## 2019-08-15 DIAGNOSIS — I10 Essential (primary) hypertension: Secondary | ICD-10-CM | POA: Diagnosis not present

## 2019-08-15 DIAGNOSIS — C4442 Squamous cell carcinoma of skin of scalp and neck: Secondary | ICD-10-CM | POA: Diagnosis not present

## 2019-08-15 DIAGNOSIS — K219 Gastro-esophageal reflux disease without esophagitis: Secondary | ICD-10-CM | POA: Insufficient documentation

## 2019-08-15 DIAGNOSIS — Z9889 Other specified postprocedural states: Secondary | ICD-10-CM | POA: Diagnosis not present

## 2019-08-15 DIAGNOSIS — M1990 Unspecified osteoarthritis, unspecified site: Secondary | ICD-10-CM | POA: Insufficient documentation

## 2019-08-15 HISTORY — PX: INCISION AND DRAINAGE OF WOUND: SHX1803

## 2019-08-15 SURGERY — IRRIGATION AND DEBRIDEMENT WOUND
Anesthesia: Monitor Anesthesia Care | Site: Scalp

## 2019-08-15 MED ORDER — SODIUM CHLORIDE 0.9 % IV SOLN
INTRAVENOUS | Status: AC | PRN
  Administered 2019-08-15: 20 mL

## 2019-08-15 MED ORDER — PROPOFOL 10 MG/ML IV BOLUS
INTRAVENOUS | Status: DC | PRN
  Administered 2019-08-15: 20 mg via INTRAVENOUS

## 2019-08-15 MED ORDER — HYDROCODONE-ACETAMINOPHEN 5-325 MG PO TABS: 1.0000 | ORAL_TABLET | Freq: Three times a day (TID) | ORAL | 0 refills | Status: AC | PRN

## 2019-08-15 MED ORDER — SODIUM CHLORIDE (PF) 0.9 % IJ SOLN
INTRAMUSCULAR | Status: AC
  Filled 2019-08-15: qty 10

## 2019-08-15 MED ORDER — FENTANYL CITRATE (PF) 100 MCG/2ML IJ SOLN
INTRAMUSCULAR | Status: AC
  Filled 2019-08-15: qty 2

## 2019-08-15 MED ORDER — BUPIVACAINE HCL (PF) 0.25 % IJ SOLN
INTRAMUSCULAR | Status: AC
  Filled 2019-08-15: qty 30

## 2019-08-15 MED ORDER — LIDOCAINE 2% (20 MG/ML) 5 ML SYRINGE
INTRAMUSCULAR | Status: DC | PRN
  Administered 2019-08-15: 60 mg via INTRAVENOUS

## 2019-08-15 MED ORDER — LIDOCAINE-EPINEPHRINE 1 %-1:100000 IJ SOLN
INTRAMUSCULAR | Status: AC
  Filled 2019-08-15: qty 1

## 2019-08-15 MED ORDER — FENTANYL CITRATE (PF) 100 MCG/2ML IJ SOLN
INTRAMUSCULAR | Status: DC | PRN
  Administered 2019-08-15: 25 ug via INTRAVENOUS
  Administered 2019-08-15: 50 ug via INTRAVENOUS

## 2019-08-15 MED ORDER — LIDOCAINE-EPINEPHRINE 1 %-1:100000 IJ SOLN
INTRAMUSCULAR | Status: DC | PRN
  Administered 2019-08-15: 20 mL

## 2019-08-15 MED ORDER — LIDOCAINE 2% (20 MG/ML) 5 ML SYRINGE
INTRAMUSCULAR | Status: AC
  Filled 2019-08-15: qty 5

## 2019-08-15 MED ORDER — LACTATED RINGERS IV SOLN: INTRAVENOUS | Status: DC

## 2019-08-15 MED ORDER — BACITRACIN ZINC 500 UNIT/GM EX OINT
TOPICAL_OINTMENT | CUTANEOUS | Status: AC
  Filled 2019-08-15: qty 28.35

## 2019-08-15 MED ORDER — ONDANSETRON HCL 4 MG/2ML IJ SOLN
INTRAMUSCULAR | Status: AC
  Filled 2019-08-15: qty 2

## 2019-08-15 MED ORDER — DEXAMETHASONE SODIUM PHOSPHATE 4 MG/ML IJ SOLN
INTRAMUSCULAR | Status: DC | PRN
  Administered 2019-08-15: 4 mg via INTRAVENOUS

## 2019-08-15 MED ORDER — CLINDAMYCIN PHOSPHATE 900 MG/50ML IV SOLN
INTRAVENOUS | Status: AC
  Filled 2019-08-15: qty 50

## 2019-08-15 MED ORDER — PROPOFOL 500 MG/50ML IV EMUL
INTRAVENOUS | Status: DC | PRN
  Administered 2019-08-15: 25 ug/kg/min via INTRAVENOUS
  Administered 2019-08-15: 25 ug via INTRAVENOUS

## 2019-08-15 MED ORDER — DEXAMETHASONE SODIUM PHOSPHATE 10 MG/ML IJ SOLN
INTRAMUSCULAR | Status: AC
  Filled 2019-08-15: qty 1

## 2019-08-15 MED ORDER — PHENYLEPHRINE 40 MCG/ML (10ML) SYRINGE FOR IV PUSH (FOR BLOOD PRESSURE SUPPORT)
PREFILLED_SYRINGE | INTRAVENOUS | Status: DC | PRN
  Administered 2019-08-15: 80 ug via INTRAVENOUS

## 2019-08-15 MED ORDER — CLINDAMYCIN PHOSPHATE 900 MG/50ML IV SOLN
900.0000 mg | INTRAVENOUS | Status: AC
  Administered 2019-08-15: 900 mg via INTRAVENOUS

## 2019-08-15 SURGICAL SUPPLY — 67 items
ADH SKN CLS APL DERMABOND .7 (GAUZE/BANDAGES/DRESSINGS)
APL SKNCLS STERI-STRIP NONHPOA (GAUZE/BANDAGES/DRESSINGS)
BAG DECANTER FOR FLEXI CONT (MISCELLANEOUS) IMPLANT
BALL CTTN LRG ABS STRL LF (GAUZE/BANDAGES/DRESSINGS) ×2
BENZOIN TINCTURE PRP APPL 2/3 (GAUZE/BANDAGES/DRESSINGS) IMPLANT
BLADE CLIPPER SURG (BLADE) IMPLANT
BLADE DERMATOME SS (BLADE) IMPLANT
BLADE HEX COATED 2.75 (ELECTRODE) IMPLANT
BLADE SURG 10 STRL SS (BLADE) IMPLANT
BLADE SURG 15 STRL LF DISP TIS (BLADE) ×1 IMPLANT
BLADE SURG 15 STRL SS (BLADE) ×2
BNDG COHESIVE 4X5 TAN STRL (GAUZE/BANDAGES/DRESSINGS) IMPLANT
BNDG ELASTIC 4X5.8 VLCR STR LF (GAUZE/BANDAGES/DRESSINGS) IMPLANT
CANISTER SUCT 1200ML W/VALVE (MISCELLANEOUS) IMPLANT
COTTONBALL LRG STERILE PKG (GAUZE/BANDAGES/DRESSINGS) ×4 IMPLANT
COVER BACK TABLE 60X90IN (DRAPES) ×2 IMPLANT
COVER MAYO STAND STRL (DRAPES) ×2 IMPLANT
COVER WAND RF STERILE (DRAPES) IMPLANT
DECANTER SPIKE VIAL GLASS SM (MISCELLANEOUS) IMPLANT
DERMABOND ADVANCED (GAUZE/BANDAGES/DRESSINGS)
DERMABOND ADVANCED .7 DNX12 (GAUZE/BANDAGES/DRESSINGS) IMPLANT
DERMACARRIERS GRAFT 1 TO 1.5 (DISPOSABLE)
DRAPE LAPAROTOMY 100X72 PEDS (DRAPES) IMPLANT
DRAPE U-SHAPE 76X120 STRL (DRAPES) IMPLANT
DRSG PAD ABDOMINAL 8X10 ST (GAUZE/BANDAGES/DRESSINGS) IMPLANT
DRSG TEGADERM 4X10 (GAUZE/BANDAGES/DRESSINGS) IMPLANT
ELECT REM PT RETURN 9FT ADLT (ELECTROSURGICAL) ×2
ELECTRODE REM PT RTRN 9FT ADLT (ELECTROSURGICAL) IMPLANT
GAUZE 4X4 16PLY RFD (DISPOSABLE) IMPLANT
GAUZE SPONGE 4X4 12PLY STRL (GAUZE/BANDAGES/DRESSINGS) ×2 IMPLANT
GAUZE XEROFORM 5X9 LF (GAUZE/BANDAGES/DRESSINGS) ×2 IMPLANT
GLOVE BIO SURGEON STRL SZ 6.5 (GLOVE) ×3 IMPLANT
GLOVE BIOGEL M STRL SZ7.5 (GLOVE) ×2 IMPLANT
GLOVE BIOGEL PI IND STRL 7.0 (GLOVE) IMPLANT
GLOVE BIOGEL PI IND STRL 8 (GLOVE) ×1 IMPLANT
GLOVE BIOGEL PI INDICATOR 7.0 (GLOVE) ×2
GLOVE BIOGEL PI INDICATOR 8 (GLOVE) ×1
GOWN STRL REUS W/ TWL LRG LVL3 (GOWN DISPOSABLE) ×1 IMPLANT
GOWN STRL REUS W/TWL LRG LVL3 (GOWN DISPOSABLE) ×8
GRAFT DERMACARRIERS 1 TO 1.5 (DISPOSABLE) IMPLANT
MATRIX WOUND MESHED 2X2 (Tissue) IMPLANT
NDL PRECISIONGLIDE 27X1.5 (NEEDLE) ×1 IMPLANT
NEEDLE PRECISIONGLIDE 27X1.5 (NEEDLE) ×2 IMPLANT
NS IRRIG 1000ML POUR BTL (IV SOLUTION) ×2 IMPLANT
PAD CAST 4YDX4 CTTN HI CHSV (CAST SUPPLIES) IMPLANT
PADDING CAST COTTON 4X4 STRL (CAST SUPPLIES)
PENCIL SMOKE EVACUATOR (MISCELLANEOUS) ×1 IMPLANT
SET BASIN DAY SURGERY F.S. (CUSTOM PROCEDURE TRAY) ×2 IMPLANT
SHEET MEDIUM DRAPE 40X70 STRL (DRAPES) IMPLANT
STRIP CLOSURE SKIN 1/2X4 (GAUZE/BANDAGES/DRESSINGS) IMPLANT
SUCTION FRAZIER HANDLE 10FR (MISCELLANEOUS)
SUCTION TUBE FRAZIER 10FR DISP (MISCELLANEOUS) IMPLANT
SUT CHROMIC 5 0 P 3 (SUTURE) IMPLANT
SUT ETHILON 4 0 P 3 18 (SUTURE) IMPLANT
SUT MON AB 4-0 PC3 18 (SUTURE) IMPLANT
SUT MON AB 5-0 PS2 18 (SUTURE) IMPLANT
SUT PROLENE 4 0 P 3 18 (SUTURE) IMPLANT
SUT VIC AB 3-0 FS2 27 (SUTURE) IMPLANT
SUT VICRYL 4-0 PS2 18IN ABS (SUTURE) IMPLANT
SYR BULB EAR ULCER 3OZ GRN STR (SYRINGE) ×2 IMPLANT
SYR CONTROL 10ML LL (SYRINGE) ×2 IMPLANT
TOWEL GREEN STERILE FF (TOWEL DISPOSABLE) ×2 IMPLANT
TRAY DSU PREP LF (CUSTOM PROCEDURE TRAY) ×2 IMPLANT
TUBE CONNECTING 20X1/4 (TUBING) ×1 IMPLANT
UNDERPAD 30X36 HEAVY ABSORB (UNDERPADS AND DIAPERS) IMPLANT
WOUND MATRIX MESHED 2X2 (Tissue) ×2 IMPLANT
YANKAUER SUCT BULB TIP NO VENT (SUCTIONS) ×1 IMPLANT

## 2019-08-15 NOTE — Brief Op Note (Signed)
08/15/2019  12:25 PM  PATIENT:  Crystal Vazquez  86 y.o. female  PRE-OPERATIVE DIAGNOSIS:  squamous cell carcinoma of scalp/neck  POST-OPERATIVE DIAGNOSIS:  squamous cell carcinoma of scalp/neck  PROCEDURE:  Procedure(s): Debridement of scalp wound and placement of Integra bilayer (N/A)  SURGEON:  Surgeon(s) and Role:    * Zacchaeus Halm, Steffanie Dunn, MD - Primary  PHYSICIAN ASSISTANT: Phoebe Sharps, PA  ASSISTANTS: none   ANESTHESIA:   general  EBL:  5   BLOOD ADMINISTERED:none  DRAINS: none   LOCAL MEDICATIONS USED:  XYLOCAINE   SPECIMEN:  No Specimen  DISPOSITION OF SPECIMEN:  N/A  COUNTS:  YES  TOURNIQUET:  * No tourniquets in log *  DICTATION: .Dragon Dictation  PLAN OF CARE: Discharge to home after PACU  PATIENT DISPOSITION:  PACU - hemodynamically stable.   Delay start of Pharmacological VTE agent (>24hrs) due to surgical blood loss or risk of bleeding: not applicable

## 2019-08-15 NOTE — Interval H&P Note (Signed)
History and Physical Interval Note:  08/15/2019 11:31 AM  Crystal Vazquez  has presented today for surgery, with the diagnosis of squamous cell carcinoma of scalp/neck.  The various methods of treatment have been discussed with the patient and family. After consideration of risks, benefits and other options for treatment, the patient has consented to  Procedure(s): Debridement of scalp wound and placement of Integra bilayer (N/A) as a surgical intervention.  The patient's history has been reviewed, patient examined, no change in status, stable for surgery.  I have reviewed the patient's chart and labs.  Questions were answered to the patient's satisfaction.     [name]

## 2019-08-15 NOTE — Transfer of Care (Signed)
Immediate Anesthesia Transfer of Care Note  Patient: Byron L Madrid  Procedure(s) Performed: Debridement of scalp wound and placement of Integra bilayer (N/A Scalp)  Patient Location: PACU  Anesthesia Type:MAC  Level of Consciousness: awake  Airway & Oxygen Therapy: Patient Spontanous Breathing and Patient connected to face mask oxygen  Post-op Assessment: Report given to RN and Post -op Vital signs reviewed and stable  Post vital signs: Reviewed and stable  Last Vitals:  Vitals Value Taken Time  BP    Temp    Pulse 68 08/15/19 1231  Resp 20 08/15/19 1231  SpO2 100 % 08/15/19 1231  Vitals shown include unvalidated device data.  Last Pain:  Vitals:   08/15/19 1014  TempSrc: Oral  PainSc: 0-No pain         Complications: No apparent anesthesia complications

## 2019-08-15 NOTE — Discharge Instructions (Signed)
  Post Anesthesia Home Care Instructions  Activity: Get plenty of rest for the remainder of the day. A responsible individual must stay with you for 24 hours following the procedure.  For the next 24 hours, DO NOT: -Drive a car -Paediatric nurse -Drink alcoholic beverages -Take any medication unless instructed by your physician -Make any legal decisions or sign important papers.  Meals: Start with liquid foods such as gelatin or soup. Progress to regular foods as tolerated. Avoid greasy, spicy, heavy foods. If nausea and/or vomiting occur, drink only clear liquids until the nausea and/or vomiting subsides. Call your physician if vomiting continues.  Special Instructions/Symptoms: Your throat may feel dry or sore from the anesthesia or the breathing tube placed in your throat during surgery. If this causes discomfort, gargle with warm salt water. The discomfort should disappear within 24 hours.  If you had a scopolamine patch placed behind your ear for the management of post- operative nausea and/or vomiting:  1. The medication in the patch is effective for 72 hours, after which it should be removed.  Wrap patch in a tissue and discard in the trash. Wash hands thoroughly with soap and water. 2. You may remove the patch earlier than 72 hours if you experience unpleasant side effects which may include dry mouth, dizziness or visual disturbances. 3. Avoid touching the patch. Wash your hands with soap and water after contact with the patch.       Activity: As tolerated, but avoid strenuous activity until follow up visit.  Diet: Regular  Wound Care: Keep dressing clean & dry until your follow-up visit.  Leave yellow bolster in place, do not remove.   Special Instructions:  Call our office if any unusual problems occur such as pain, excessive bleeding, unrelieved nausea/vomiting, fever &/or chills.  Follow-up appointment: Scheduled for next week on June 9.

## 2019-08-15 NOTE — Op Note (Signed)
Operative Note   DATE OF OPERATION: 08/15/2019  SURGICAL DEPARTMENT: Plastic Surgery  PREOPERATIVE DIAGNOSES: Posterior scalp wound  POSTOPERATIVE DIAGNOSES:  same  PROCEDURE: Debridement of posterior scalp wound totaling 3 x 3 cm with placement of meshed Integra bilayer  SURGEON: Talmadge Coventry, MD  ASSISTANT: Phoebe Sharps, PA The advanced practice practitioner (APP) assisted throughout the case.  The APP was essential in retraction and counter traction when needed to make the case progress smoothly.  This retraction and assistance made it possible to see the tissue plans for the procedure.  The assistance was needed for blood control, tissue re-approximation and assisted with closure of the incision site.  ANESTHESIA:  General.   COMPLICATIONS: None.   INDICATIONS FOR PROCEDURE:  The patient, Crystal Vazquez is a 86 y.o. female born on September 03, 1926, is here for treatment of posterior scalp wound with exposed calvarium MRN: [id number]  CONSENT:  Informed consent was obtained directly from the patient. Risks, benefits and alternatives were fully discussed. Specific risks including but not limited to bleeding, infection, hematoma, seroma, scarring, pain, contracture, asymmetry, wound healing problems, and need for further surgery were all discussed. The patient did have an ample opportunity to have questions answered to satisfaction.   DESCRIPTION OF PROCEDURE:  The patient was taken to the operating room. SCDs were placed and antibiotics were given.  MAC anesthesia was administered.  The patient's operative site was prepped and draped in a sterile fashion. A time out was performed and all information was confirmed to be correct.  The area was anesthetized with 1% lidocaine with epinephrine.  This was given time to work.  The edges of the wound were then debrided with a 15 blade in an excisional manner.  All necrotic debris was debrided from the wound bed with pickups and a 15 blade.   It was then irrigated copiously.  A meshed sheet of Integra bilayer was then brought onto the field and soaked for 5 minutes.  It was then applied to the wound with the silicone layer facing up and secured with a combination of nylon and chromic sutures.  A bolster was then fashioned with Xeroform, a scrub brush punch, and 3-0 nylon.  She was then covered with a soft dressing.  The patient tolerated the procedure well.  There were no complications. The patient was allowed to wake from anesthesia, extubated and taken to the recovery room in satisfactory condition.

## 2019-08-15 NOTE — Anesthesia Postprocedure Evaluation (Signed)
Anesthesia Post Note  Patient: Crystal Vazquez  Procedure(s) Performed: Debridement of scalp wound and placement of Integra bilayer (N/A Scalp)     Patient location during evaluation: PACU Anesthesia Type: MAC Level of consciousness: awake and alert Pain management: pain level controlled Vital Signs Assessment: post-procedure vital signs reviewed and stable Respiratory status: spontaneous breathing, nonlabored ventilation, respiratory function stable and patient connected to nasal cannula oxygen Cardiovascular status: stable and blood pressure returned to baseline Postop Assessment: no apparent nausea or vomiting Anesthetic complications: no    Last Vitals:  Vitals:   08/15/19 1300 08/15/19 1316  BP: 121/64 (!) 156/70  Pulse: (!) 59 66  Resp: 16 18  Temp:  (!) 36.4 C  SpO2: 97% 97%    Last Pain:  Vitals:   08/15/19 1316  TempSrc: Oral  PainSc: 0-No pain                 Barnet Glasgow

## 2019-08-15 NOTE — Anesthesia Procedure Notes (Signed)
Procedure Name: MAC Date/Time: 08/15/2019 11:58 AM Performed by: Lieutenant Diego, CRNA Pre-anesthesia Checklist: Patient identified, Emergency Drugs available, Suction available, Patient being monitored and Timeout performed Patient Re-evaluated:Patient Re-evaluated prior to induction Oxygen Delivery Method: Simple face mask Preoxygenation: Pre-oxygenation with 100% oxygen

## 2019-08-16 ENCOUNTER — Encounter: Payer: Self-pay | Admitting: *Deleted

## 2019-08-23 ENCOUNTER — Other Ambulatory Visit: Payer: Self-pay

## 2019-08-23 ENCOUNTER — Ambulatory Visit (INDEPENDENT_AMBULATORY_CARE_PROVIDER_SITE_OTHER): Payer: Medicare HMO | Admitting: Plastic Surgery

## 2019-08-23 ENCOUNTER — Encounter: Payer: Self-pay | Admitting: Plastic Surgery

## 2019-08-23 VITALS — BP 133/68 | HR 72 | Temp 97.3°F

## 2019-08-23 DIAGNOSIS — C4442 Squamous cell carcinoma of skin of scalp and neck: Secondary | ICD-10-CM | POA: Diagnosis not present

## 2019-08-23 DIAGNOSIS — L988 Other specified disorders of the skin and subcutaneous tissue: Secondary | ICD-10-CM | POA: Diagnosis not present

## 2019-08-23 NOTE — Progress Notes (Signed)
Patient is here postop from debridement of a scalp wound from a Mohs excision and placement of Integra.  She is doing well.  She reports minimal pain or discomfort.  On exam the Integra looks like it is incorporating appropriately.  I removed the bolster today.  We will plan to have her dress the wound with Xeroform as she is still doing for the vertex wound and I will see her again in 3 to 4 weeks.  All her questions were answered.

## 2019-08-24 DIAGNOSIS — H18593 Other hereditary corneal dystrophies, bilateral: Secondary | ICD-10-CM | POA: Diagnosis not present

## 2019-08-24 DIAGNOSIS — H04123 Dry eye syndrome of bilateral lacrimal glands: Secondary | ICD-10-CM | POA: Diagnosis not present

## 2019-08-24 DIAGNOSIS — H401111 Primary open-angle glaucoma, right eye, mild stage: Secondary | ICD-10-CM | POA: Diagnosis not present

## 2019-08-24 DIAGNOSIS — H401122 Primary open-angle glaucoma, left eye, moderate stage: Secondary | ICD-10-CM | POA: Diagnosis not present

## 2019-08-28 ENCOUNTER — Encounter (INDEPENDENT_AMBULATORY_CARE_PROVIDER_SITE_OTHER): Payer: Medicare HMO | Admitting: Ophthalmology

## 2019-08-28 ENCOUNTER — Other Ambulatory Visit: Payer: Self-pay

## 2019-08-28 DIAGNOSIS — H59032 Cystoid macular edema following cataract surgery, left eye: Secondary | ICD-10-CM

## 2019-08-28 DIAGNOSIS — H318 Other specified disorders of choroid: Secondary | ICD-10-CM

## 2019-08-28 DIAGNOSIS — H43813 Vitreous degeneration, bilateral: Secondary | ICD-10-CM

## 2019-08-28 DIAGNOSIS — H35033 Hypertensive retinopathy, bilateral: Secondary | ICD-10-CM

## 2019-08-28 DIAGNOSIS — I10 Essential (primary) hypertension: Secondary | ICD-10-CM

## 2019-08-31 ENCOUNTER — Telehealth: Payer: Self-pay | Admitting: Surgical

## 2019-08-31 NOTE — Telephone Encounter (Signed)
Discussed with nursing staff to call [facility].

## 2019-08-31 NOTE — Telephone Encounter (Signed)
Patient called to advise that [name] was supposed to call in some xeroform and she is down to the last one. Please call patient to advise about refill and what she can use if she will not be able to get the supplies in the next day or so.

## 2019-09-04 DIAGNOSIS — Z961 Presence of intraocular lens: Secondary | ICD-10-CM | POA: Diagnosis not present

## 2019-09-04 DIAGNOSIS — H401134 Primary open-angle glaucoma, bilateral, indeterminate stage: Secondary | ICD-10-CM | POA: Diagnosis not present

## 2019-09-05 ENCOUNTER — Other Ambulatory Visit: Payer: Self-pay

## 2019-09-05 ENCOUNTER — Encounter (INDEPENDENT_AMBULATORY_CARE_PROVIDER_SITE_OTHER): Payer: Medicare HMO | Admitting: Ophthalmology

## 2019-09-05 DIAGNOSIS — H3561 Retinal hemorrhage, right eye: Secondary | ICD-10-CM | POA: Diagnosis not present

## 2019-09-05 NOTE — Telephone Encounter (Signed)
Patient left message stating she still needs a refill on xeroform 4x4 dressing. Patient called on 08/31/2019 with only one left. Please call patient to advise if [facility] has been contacted for refill and what she can use until she receives. [phone].

## 2019-09-07 DIAGNOSIS — Z961 Presence of intraocular lens: Secondary | ICD-10-CM | POA: Diagnosis not present

## 2019-09-07 DIAGNOSIS — H401134 Primary open-angle glaucoma, bilateral, indeterminate stage: Secondary | ICD-10-CM | POA: Diagnosis not present

## 2019-09-07 NOTE — Telephone Encounter (Addendum)
Called and spoke with [facility] regarding the message below.  Was asked to fax new order Faxed new order to [facility] on (09/06/19) for Xeroform for the patient.  Confirmation received.  Copy scanned into the chart.  Patient informed new order was faxed to [facility] for new Xeroform size.  Patient verbalized understanding and agreed.//AB/CMA

## 2019-09-13 DIAGNOSIS — Z79899 Other long term (current) drug therapy: Secondary | ICD-10-CM | POA: Diagnosis not present

## 2019-09-13 DIAGNOSIS — Z85828 Personal history of other malignant neoplasm of skin: Secondary | ICD-10-CM | POA: Diagnosis not present

## 2019-09-13 DIAGNOSIS — H401123 Primary open-angle glaucoma, left eye, severe stage: Secondary | ICD-10-CM | POA: Diagnosis not present

## 2019-09-13 DIAGNOSIS — Z96611 Presence of right artificial shoulder joint: Secondary | ICD-10-CM | POA: Diagnosis not present

## 2019-09-13 DIAGNOSIS — I10 Essential (primary) hypertension: Secondary | ICD-10-CM | POA: Diagnosis not present

## 2019-09-15 ENCOUNTER — Other Ambulatory Visit: Payer: Self-pay

## 2019-09-15 ENCOUNTER — Telehealth: Payer: Self-pay | Admitting: *Deleted

## 2019-09-15 ENCOUNTER — Encounter: Payer: Self-pay | Admitting: Surgical

## 2019-09-15 ENCOUNTER — Ambulatory Visit: Payer: Medicare HMO | Admitting: Surgical

## 2019-09-15 ENCOUNTER — Ambulatory Visit (INDEPENDENT_AMBULATORY_CARE_PROVIDER_SITE_OTHER): Payer: Medicare HMO | Admitting: Surgical

## 2019-09-15 VITALS — BP 157/77 | HR 68 | Temp 97.7°F

## 2019-09-15 DIAGNOSIS — C4442 Squamous cell carcinoma of skin of scalp and neck: Secondary | ICD-10-CM

## 2019-09-15 NOTE — Telephone Encounter (Signed)
Called [facility] regarding the patient having not received the order for Xeroform.  Was informed that because of the patient's insurance they are not able to ship the next supplies until (09/22/19).    Informed the patient and her son of the information from [facility] regarding the patient not having received the supplies.  Patient and son verbalized understanding.    Also gave verbal orders to [facility] for Collagen dressing QOD for the patient-per Matthew,PA-C.   Was informed by [facility] that they will go ahead and ship out the Collagen for the patient.//AB/CMA

## 2019-09-15 NOTE — Telephone Encounter (Signed)
Received Standard written orders from [facility] via of fax.  Requesting signature and return.   Orders signed,faxed and confirmation received and copy scanned into the chart.//AB/CMA

## 2019-09-15 NOTE — Progress Notes (Signed)
   Subjective:     Patient ID: [name], female    DOB: [birth year], 86 y.o.   MRN: [id number]  Chief Complaint  Patient presents with  . Follow-up    HPI: The patient is a 86 y.o. female here for follow-up on her scalp wound.  She underwent debridement after Mohs excision and placement of Integra.  She is doing well and reports minimal pain.  She is here with her son today.  Integra has incorporated, silicone sheet in place.  No complaints.  Review of Systems  Constitutional: Negative.   Skin: Positive for wound. Negative for rash.     Objective:   Vital Signs BP (!) 157/77 (BP Location: Left Arm, Patient Position: Sitting, Cuff Size: Large)   Pulse 68   Temp 97.7 F (36.5 C) (Temporal)   SpO2 99%  Vital Signs and Nursing Note Reviewed  Physical Exam Constitutional:      Appearance: Normal appearance.  HENT:     Head:      Comments: Anterior and posterior scalp wounds noted.  Good granulation tissue noted.  No periwound erythema.  No purulent drainage.  No foul odor noted.  Some macerated skin noted at the peripheral margins of the anterior wound. Neurological:     General: No focal deficit present.     Mental Status: She is alert and oriented to person, place, and time.  Psychiatric:        Mood and Affect: Mood normal.        Behavior: Behavior normal.       Assessment/Plan:     ICD-10-CM   1. SCC (squamous cell carcinoma), scalp/neck  C44.42    No sign of infection.  Recommended collagen dressing changes every other day.  Apply small amount of K-Y jelly to collagen and place on anterior and posterior scalp wound.  Recommend covering with 4 x 4's and ABD pad.  Patient is currently doing all the dressing changes on her own and would not like assistance with home health at this time.  I discussed with her that if she ever feels that she needs some assistance that we would be happy to set up home health for her.  I applied collagen to her scalp today.  I  showed her and her family member how to do the dressing change and she feels comfortable doing it at this time.  Discussed the patient that if she feels uncomfortable doing the collagen dressing changes that she could continue with Xeroform dressing changes instead.  We will order supplies from [facility].  Follow-up in 3 weeks, call with any questions or concerns.   Carola Rhine Dekota Kirlin, PA-C 09/19/2019, 7:11 AM

## 2019-09-19 DIAGNOSIS — L988 Other specified disorders of the skin and subcutaneous tissue: Secondary | ICD-10-CM | POA: Diagnosis not present

## 2019-09-29 ENCOUNTER — Other Ambulatory Visit: Payer: Self-pay

## 2019-09-29 ENCOUNTER — Encounter: Payer: Self-pay | Admitting: Surgical

## 2019-09-29 ENCOUNTER — Ambulatory Visit: Payer: Medicare HMO | Admitting: Surgical

## 2019-09-29 VITALS — BP 126/71 | HR 66 | Temp 97.7°F

## 2019-09-29 DIAGNOSIS — C4442 Squamous cell carcinoma of skin of scalp and neck: Secondary | ICD-10-CM | POA: Diagnosis not present

## 2019-09-29 NOTE — Progress Notes (Signed)
   Subjective:     Patient ID: Crystal Vazquez, female    DOB: 12/20/1926, 86 y.o.   MRN: [id number]  Chief Complaint  Patient presents with  . Follow-up    HPI: The patient is a 86 y.o. female here for follow-up on her scalp wounds.  She reports she has been applying collagen dressings daily.  She also reports that she has been washing her head with daily with a mixture of vinegar and water.  She reports she is feeling fine without any infectious symptoms including fevers, chills, nausea, vomiting.  She is here with her son today.    Review of Systems  Constitutional: Negative.   Skin: Positive for wound.     Objective:   Vital Signs BP 126/71 (BP Location: Left Arm, Patient Position: Sitting, Cuff Size: Large)   Pulse 66   Temp 97.7 F (36.5 C) (Oral)   SpO2 95%  Vital Signs and Nursing Note Reviewed Physical Exam Constitutional:      Appearance: Normal appearance.  HENT:     Head:   Pulmonary:     Effort: Pulmonary effort is normal.  Neurological:     General: No focal deficit present.     Mental Status: She is alert and oriented to person, place, and time.  Psychiatric:        Mood and Affect: Mood normal.        Behavior: Behavior normal.     Assessment/Plan:     ICD-10-CM   1. SCC (squamous cell carcinoma), scalp/neck  C44.42     Recommend continuing with collagen dressing changes every other day.  Patient reports she has been changing it daily, I instructed her to do this every other day instead to allow the collagen to incorporate.  Also recommend decreasing sugar intake, she reports she has been drinking a lot of Gatorade.  Recommend protein shakes instead.  Recommend calling with questions or concerns. Follow up scheduled for 10/19/2019  Pictures were obtained of the patient and placed in the chart with the patient's or guardian's permission.    Carola Rhine Teagan Heidrick, PA-C 09/29/2019, 12:02 PM

## 2019-10-02 ENCOUNTER — Other Ambulatory Visit: Payer: Self-pay

## 2019-10-02 ENCOUNTER — Encounter (INDEPENDENT_AMBULATORY_CARE_PROVIDER_SITE_OTHER): Payer: Medicare HMO | Admitting: Ophthalmology

## 2019-10-02 DIAGNOSIS — H35033 Hypertensive retinopathy, bilateral: Secondary | ICD-10-CM

## 2019-10-02 DIAGNOSIS — H353211 Exudative age-related macular degeneration, right eye, with active choroidal neovascularization: Secondary | ICD-10-CM | POA: Diagnosis not present

## 2019-10-02 DIAGNOSIS — I10 Essential (primary) hypertension: Secondary | ICD-10-CM | POA: Diagnosis not present

## 2019-10-07 DIAGNOSIS — K219 Gastro-esophageal reflux disease without esophagitis: Secondary | ICD-10-CM | POA: Diagnosis not present

## 2019-10-07 DIAGNOSIS — G4700 Insomnia, unspecified: Secondary | ICD-10-CM | POA: Diagnosis not present

## 2019-10-07 DIAGNOSIS — Z8249 Family history of ischemic heart disease and other diseases of the circulatory system: Secondary | ICD-10-CM | POA: Diagnosis not present

## 2019-10-07 DIAGNOSIS — I739 Peripheral vascular disease, unspecified: Secondary | ICD-10-CM | POA: Diagnosis not present

## 2019-10-07 DIAGNOSIS — M0550 Rheumatoid polyneuropathy with rheumatoid arthritis of unspecified site: Secondary | ICD-10-CM | POA: Diagnosis not present

## 2019-10-07 DIAGNOSIS — Z008 Encounter for other general examination: Secondary | ICD-10-CM | POA: Diagnosis not present

## 2019-10-07 DIAGNOSIS — H409 Unspecified glaucoma: Secondary | ICD-10-CM | POA: Diagnosis not present

## 2019-10-07 DIAGNOSIS — E785 Hyperlipidemia, unspecified: Secondary | ICD-10-CM | POA: Diagnosis not present

## 2019-10-07 DIAGNOSIS — R32 Unspecified urinary incontinence: Secondary | ICD-10-CM | POA: Diagnosis not present

## 2019-10-07 DIAGNOSIS — K5900 Constipation, unspecified: Secondary | ICD-10-CM | POA: Diagnosis not present

## 2019-10-07 DIAGNOSIS — I10 Essential (primary) hypertension: Secondary | ICD-10-CM | POA: Diagnosis not present

## 2019-10-19 ENCOUNTER — Ambulatory Visit: Payer: Medicare HMO | Admitting: Surgical

## 2019-10-19 ENCOUNTER — Other Ambulatory Visit: Payer: Self-pay

## 2019-10-19 ENCOUNTER — Encounter: Payer: Self-pay | Admitting: Surgical

## 2019-10-19 VITALS — BP 145/76 | HR 72 | Temp 97.8°F

## 2019-10-19 DIAGNOSIS — C4442 Squamous cell carcinoma of skin of scalp and neck: Secondary | ICD-10-CM

## 2019-10-19 NOTE — Progress Notes (Signed)
   Subjective:     Patient ID: Crystal Vazquez, female    DOB: [birth year], 86 y.o.   MRN: [id number]  Chief Complaint  Patient presents with  . Follow-up    HPI: The patient is a 86 y.o. female here for follow-up on her scalp wounds.  She reports that she has been applying Xeroform dressings, she reports that she does not recall ever applying collagen.  She is here with her son today.  She seems slightly confused about the current wound care regimen.  She reports that overall she is doing well.  She would not like assistance with dressing changes at home from home health at this time.    Review of Systems  Constitutional: Negative for activity change, chills and fever.  Skin: Positive for wound.     Objective:   Vital Signs BP (!) 145/76 (BP Location: Right Arm, Patient Position: Sitting, Cuff Size: Normal)   Pulse 72   Temp 97.8 F (36.6 C) (Oral)   SpO2 96%  Vital Signs and Nursing Note Reviewed  Physical Exam Constitutional:      General: She is not in acute distress.    Appearance: Normal appearance. She is not ill-appearing.  HENT:     Head:   Pulmonary:     Effort: Pulmonary effort is normal.  Neurological:     General: No focal deficit present.     Mental Status: She is alert and oriented to person, place, and time.  Psychiatric:        Mood and Affect: Mood normal.        Behavior: Behavior normal.     Comments: Some forgetfulness       Assessment/Plan:     ICD-10-CM   1. SCC (squamous cell carcinoma), scalp/neck  C44.42    It is difficult to determine if the patient has been applying Xeroform or collagen to her scalp daily.  I had previously discussed with her that I would recommend collagen dressing changes every other day, but she believes she has been doing Xeroform dressings daily.  Upon dressing her wound today with collagen she did recognize the dressing and feels as if she has also been applying this intermittently as well.  Overall she is  doing well, there is some new epithelialization.  Minimal improvement in the posterior wound, but it all appears clean, no infection.  Recommend calling with questions or concerns. Follow up scheduled for 2 weeks.  Pictures were obtained of the patient and placed in the chart with the patient's or guardian's permission.  Instructed patient to call [facility] for additional collagen supplies.  Recommend calling with any questions about dressing changes, I did provide them with a dressing change sheet and instructed her to change to collagen every other day. She understands to apply small amount of K-Y jelly with collagen to activate it  Charlies Constable, PA-C 10/19/2019, 11:22 AM

## 2019-10-31 ENCOUNTER — Telehealth: Payer: Self-pay

## 2019-10-31 NOTE — Telephone Encounter (Signed)
Faxed new order to [facility] for [place]: PP Colactive Plus 4x4

## 2019-11-02 ENCOUNTER — Other Ambulatory Visit: Payer: Self-pay

## 2019-11-02 ENCOUNTER — Encounter: Payer: Self-pay | Admitting: Surgical

## 2019-11-02 ENCOUNTER — Ambulatory Visit: Payer: Medicare HMO | Admitting: Surgical

## 2019-11-02 VITALS — BP 156/53 | HR 75 | Temp 97.8°F

## 2019-11-02 DIAGNOSIS — C4442 Squamous cell carcinoma of skin of scalp and neck: Secondary | ICD-10-CM | POA: Diagnosis not present

## 2019-11-02 DIAGNOSIS — L988 Other specified disorders of the skin and subcutaneous tissue: Secondary | ICD-10-CM | POA: Diagnosis not present

## 2019-11-02 NOTE — Progress Notes (Signed)
   Subjective:     Patient ID: Crystal Vazquez, female    DOB: Nov 22, 1926, 86 y.o.   MRN: [id number]  Chief Complaint  Patient presents with  . Follow-up    HPI: The patient is a 86 y.o. female here for follow-up on her scalp wounds. Patient was last seen on 10/19/2019.  She is here with her niece today.  She reports that she has been applying collagen every other day and this has been going well.  She has been applying a small amount of K-Y jelly as well with the collagen.  She has no complaints today.  She feels as if she has noticed some improvement in the smaller area at the back of her scalp.   Review of Systems  Constitutional: Negative.   Skin: Positive for wound.     Objective:   Vital Signs There were no vitals taken for this visit. Vital Signs and Nursing Note Reviewed Physical Exam HENT:     Head:   Pulmonary:     Effort: Pulmonary effort is normal.  Neurological:     General: No focal deficit present.     Mental Status: She is alert.  Psychiatric:        Mood and Affect: Mood normal.        Behavior: Behavior normal.     Assessment/Plan:     ICD-10-CM   1. SCC (squamous cell carcinoma), scalp/neck  C44.42     Recommend continuing with collagen dressing changes, discussed discontinuing use of K-Y jelly with application of collagen as it appears that the skin is becoming macerated and being removed with dressing changes.  Throughout this healing process her skin has been consistently macerated and using solely collagen dressing changes without K-Y jelly may decrease this.  Patient reports she is feeling well.  She has no complaints.  We did discuss some other options available for wound care, but at this time we will continue with collagen and can address these options at her next follow-up.  Recommend continuing with protein shakes.   Recommend calling with questions or concerns. Follow up scheduled for 6 weeks.  Patient knows that she can be seen sooner  if she has any issues or questions prior.  Pictures were obtained of the patient and placed in the chart with the patient's or guardian's permission.    Carola Rhine Leam Madero, PA-C 11/02/2019, 10:35 AM

## 2019-11-28 DIAGNOSIS — R69 Illness, unspecified: Secondary | ICD-10-CM | POA: Diagnosis not present

## 2019-11-29 DIAGNOSIS — R69 Illness, unspecified: Secondary | ICD-10-CM | POA: Diagnosis not present

## 2019-12-14 ENCOUNTER — Ambulatory Visit: Payer: Medicare HMO | Admitting: Surgical

## 2019-12-19 ENCOUNTER — Ambulatory Visit: Payer: Medicare HMO | Admitting: Surgical

## 2019-12-19 ENCOUNTER — Other Ambulatory Visit: Payer: Self-pay

## 2019-12-19 ENCOUNTER — Encounter: Payer: Self-pay | Admitting: Surgical

## 2019-12-19 VITALS — BP 120/66 | HR 78 | Temp 97.5°F

## 2019-12-19 DIAGNOSIS — C4442 Squamous cell carcinoma of skin of scalp and neck: Secondary | ICD-10-CM

## 2019-12-19 NOTE — Progress Notes (Signed)
   Subjective:     Patient ID: [name], female    DOB: 01/14/1927, 86 y.o.   MRN: [id number]  Chief Complaint  Patient presents with  . Follow-up    HPI: The patient is a 86 y.o. female here for follow-up on her scalp wounds.  She has been applying collagen every other day with a small amount of K-Y jelly.  She reports that overall she is doing well.  She is here with her son today.  She has no complaints.  She has not had any fevers or chills.  Review of Systems  Constitutional: Negative.   Skin: Positive for wound.   Objective:   Vital Signs BP 120/66 (BP Location: Right Arm, Patient Position: Sitting, Cuff Size: Large)   Pulse 78   Temp (!) 97.5 F (36.4 C) (Oral)   SpO2 97%  Vital Signs and Nursing Note Reviewed Physical Exam Constitutional:      Appearance: Normal appearance.  HENT:     Head:     Comments: Improvement in scalp wound at the top of her head, new epithelium noted at the edges, the new epithelium is quite thin and friable, no foul odors or purulent drainage noted.  Minimal tenderness to palpation.  No erythema.  Posterior scalp wound with improvement, new epithelium noted at the edges.  No surrounding erythema.  No foul odor noted. Pulmonary:     Effort: Pulmonary effort is normal.  Neurological:     General: No focal deficit present.     Mental Status: She is alert.  Psychiatric:        Mood and Affect: Mood normal.        Behavior: Behavior normal.     Assessment/Plan:     ICD-10-CM   1. SCC (squamous cell carcinoma), scalp/neck  C44.42    Patient is overall doing well, has made some improvements in the size of her scalp wounds in the last few weeks.  She is doing well with wound care.  I did discuss with the patient and her son about the timeline for expected healing, there is no sign of any infection.  I recommend continuing with current wound care regimen plan of small amount of K-Y jelly, collagen dressing changes every other day.    Recommend calling with questions or concerns. Follow up scheduled for 4 to 6 weeks for reevaluation.  Pictures were obtained of the patient and placed in the chart with the patient's or guardian's permission.  Carola Rhine Tremaine Earwood, PA-C 12/19/2019, 11:32 AM

## 2020-02-02 ENCOUNTER — Ambulatory Visit: Payer: Medicare HMO | Admitting: Surgical

## 2020-02-13 ENCOUNTER — Other Ambulatory Visit: Payer: Self-pay

## 2020-02-13 ENCOUNTER — Ambulatory Visit: Payer: Medicare HMO | Admitting: Surgical

## 2020-02-13 ENCOUNTER — Encounter: Payer: Self-pay | Admitting: Surgical

## 2020-02-13 VITALS — BP 138/65 | HR 71 | Temp 98.0°F

## 2020-02-13 DIAGNOSIS — C4442 Squamous cell carcinoma of skin of scalp and neck: Secondary | ICD-10-CM

## 2020-02-13 NOTE — Progress Notes (Signed)
Patient is a 86 year old female here for follow-up with her son for evaluation of her scalp.  Patient has been applying collagen every other day with a small amount of K-Y jelly followed by gauze and ABD pad.  She reports that pain has improved and she is doing well with dressing changes.  She has no complaints.  She has not had any infectious symptoms.  Her and her son are pleased with her progress so far.  On exam scalp wound with exposed granulation tissue.  Some maceration noted at the edges, improving.  No foul odor noted.  No purulence noted.  No cellulitic changes or erythema noted.  Scalp wound is approximately 6 x 7 cm.  Recommend continuing with collagen dressing changes every other day.  She has been doing good with dressing changes, however she is unable to monitor the area as frequent as I would like.  She also relies on her son for transportation who lives in [place], so this limits our ability to have more frequent follow-ups to monitor.  She also does not have any assistance at home.  She has not wanted any home health assistance at home.  I do believe if she receives some assistance, healing this wound would occur faster.  However, patient is adamant about not receiving assistance at home.  Pictures were obtained of the patient and placed in the chart with the patient's or guardian's permission.  Follow-up in 3 weeks for reevaluation.  Call with any questions or concerns.

## 2020-02-22 ENCOUNTER — Telehealth: Payer: Self-pay | Admitting: Surgical

## 2020-02-22 NOTE — Telephone Encounter (Signed)
Patient called to see if Crystal Vazquez had ordered her xeroform petroleum dressing. Please call patient to advise update.

## 2020-02-23 NOTE — Telephone Encounter (Signed)
Returned patients call. Advised she is not using xeroform, but collagen, ABD pads, 4x4 guaze and [place] Kelly to be changed every other day. Faxed order to [facility].

## 2020-03-12 ENCOUNTER — Other Ambulatory Visit: Payer: Self-pay

## 2020-03-12 ENCOUNTER — Encounter: Payer: Self-pay | Admitting: Surgical

## 2020-03-12 ENCOUNTER — Ambulatory Visit: Payer: Medicare HMO | Admitting: Surgical

## 2020-03-12 VITALS — BP 139/80 | HR 80 | Temp 98.1°F

## 2020-03-12 DIAGNOSIS — C4442 Squamous cell carcinoma of skin of scalp and neck: Secondary | ICD-10-CM | POA: Diagnosis not present

## 2020-03-12 NOTE — Progress Notes (Signed)
Patient is a 86 year old female here for follow-up with her son for evaluation of her scalp.  Patient underwent application of Integra bilayer to her scalp on 04/03/2019 with Dr. Arita Miss followed by an additional surgery on 08/15/2019 for debridement of posterior scalp wound and placement of Integra bilayer.  Patient has been applying collagen dressing changes every other day at home.  She has been recommended for home health nursing assistance, however at this time she has continued to decline the services.  She reports that she is comfortable changing the dressing at home by herself.  Patient reports that she is doing well, she reports that she feels as if things are improving.  She reports that she had a little bit of bleeding from her scalp last night.  She is continue with the collagen dressing changes.  She reports that she has been applying vinegar and water daily to the wound.  General: No acute distress, elderly woman Head: Scalp wound with no granulation tissue exposed, no maceration noted.  No purulence or cellulitic changes noted.  No erythema noted.  The area has completely scabbed over without any exposed granulation tissue noted.  She has some new epithelialization noted at the edges. No foul odor is noted.  Overall size of the scabbing area is approximately 6 x 6 cm.  She has a posterior scalp area with scabbing noted, new epithelium noted at the edges.  No erythema noted.  No foul odor is noted.  No drainage noted.  Recommend discontinuing collagen dressing changes, recommend applying Vaseline daily to the scalp wounds.  Recommend covering with 4 x 4 gauze.  Discussed with patient to avoid vinegar on the scalp wounds.  Patient and son would like to schedule a follow-up for 2 months, he is traveling from [facility] for each visit and this has been difficult for him.  They would like to schedule a follow-up in 2 months instead of 4 to 5 weeks.  I think this is reasonable, however I  do ask that they have family check on the wound for the patient to ensure there are no significant changes prior.  Patient and son are in agreement with this plan.  They are very pleased with how things are healing so far.  Pictures were obtained to the patient and placed in the chart with the patient's permission. Follow-up scheduled for 2 months.

## 2020-04-18 DIAGNOSIS — Z961 Presence of intraocular lens: Secondary | ICD-10-CM | POA: Diagnosis not present

## 2020-04-18 DIAGNOSIS — H401132 Primary open-angle glaucoma, bilateral, moderate stage: Secondary | ICD-10-CM | POA: Diagnosis not present

## 2020-04-19 DIAGNOSIS — Z6825 Body mass index (BMI) 25.0-25.9, adult: Secondary | ICD-10-CM | POA: Diagnosis not present

## 2020-04-19 DIAGNOSIS — N390 Urinary tract infection, site not specified: Secondary | ICD-10-CM | POA: Diagnosis not present

## 2020-05-15 ENCOUNTER — Other Ambulatory Visit: Payer: Self-pay

## 2020-05-15 ENCOUNTER — Telehealth: Payer: Self-pay

## 2020-05-15 ENCOUNTER — Encounter: Payer: Self-pay | Admitting: Surgical

## 2020-05-15 ENCOUNTER — Ambulatory Visit: Payer: Medicare HMO | Admitting: Surgical

## 2020-05-15 VITALS — BP 130/74 | HR 63

## 2020-05-15 DIAGNOSIS — C4442 Squamous cell carcinoma of skin of scalp and neck: Secondary | ICD-10-CM

## 2020-05-15 NOTE — Progress Notes (Signed)
Patient is a 87 year old female here for follow-up on her scalp wound.  She developed this wound after Mohs excision.  She had Integra placed in the operating room by Dr. Claudia Desanctis on 04/03/2019.  She subsequently underwent debridement of the posterior scalp wound and placement of Integra on 08/15/2019 with Dr. Claudia Desanctis.  We have been treating this with local wound care since then.  Patient has been doing this by herself at home.  She has stated multiple times that she does not want assistance from home health.  She was last seen in the office 2 months ago.  Her son is her main means of transportation and he lives a few hours away so it is difficult for them to come more often.  On exam patient is in no acute distress, alert and oriented.  Elderly female.   The scalp wound is still present, she has had a fair amount of new epithelialization.  She had a little bit of fibrinous/yellowish drainage from the posterior wound.  She had soft fibrinous exudate along the majority of the wound.  there is no foul odors.  She has some tenderness to palpation.  No erythema or cellulitic changes noted. It appears slightly larger than her previous visit, specifically in anterior portion.  I discussed with the patient that I strongly feel as if home health would be of great assistance to her as it is a difficult place for her to treat on her own.  Patient was understanding of this and she reports that at this time she would like some assistance from home health.  I discussed with her that until home health can be set up I recommend she do Xeroform dressing changes.  She can cover the area with gauze and/or ABD pad as able.  We did place a Mepilex border dressing on the area today to see if that is something that would work better for her.  I discussed with the patient that I would send in a referral for home health and they would likely reach out to her to begin scheduling appointments.  I would like them to see her 3 times per week  for dressing changes.  If they were able to assist her with dressing changes I would like them to do every other day dressing changes with collagen, 4 x 4 gauze, ABD pads.  Pictures were obtained of the patient and placed in the chart with the patient's or guardian's permission. I discussed with the patient that we could schedule a follow-up in 2 months due to her transportation issues and if she has any changes in her status or questions she is more than welcome to call us and we can see her sooner.

## 2020-05-15 NOTE — Telephone Encounter (Signed)
Faxed referral to [facility]: Collagen every other day, cover with ABD pad or cover with mepilex.  Patient has been instructed to use xeroform every other day, with same coverage until [facility] has been set up.

## 2020-05-21 ENCOUNTER — Emergency Department (HOSPITAL_COMMUNITY): Payer: Medicare HMO

## 2020-05-21 ENCOUNTER — Other Ambulatory Visit: Payer: Self-pay

## 2020-05-21 ENCOUNTER — Emergency Department (HOSPITAL_COMMUNITY)
Admission: EM | Admit: 2020-05-21 | Discharge: 2020-05-22 | Disposition: A | Discharge status: Home / Self Care | Payer: Medicare HMO | Attending: Emergency Medicine | Admitting: Emergency Medicine

## 2020-05-21 DIAGNOSIS — R1011 Right upper quadrant pain: Secondary | ICD-10-CM | POA: Diagnosis not present

## 2020-05-21 DIAGNOSIS — I10 Essential (primary) hypertension: Secondary | ICD-10-CM | POA: Insufficient documentation

## 2020-05-21 DIAGNOSIS — K5730 Diverticulosis of large intestine without perforation or abscess without bleeding: Secondary | ICD-10-CM | POA: Insufficient documentation

## 2020-05-21 DIAGNOSIS — Z85828 Personal history of other malignant neoplasm of skin: Secondary | ICD-10-CM | POA: Insufficient documentation

## 2020-05-21 DIAGNOSIS — R0789 Other chest pain: Secondary | ICD-10-CM | POA: Diagnosis not present

## 2020-05-21 DIAGNOSIS — K2900 Acute gastritis without bleeding: Secondary | ICD-10-CM | POA: Insufficient documentation

## 2020-05-21 DIAGNOSIS — Z79899 Other long term (current) drug therapy: Secondary | ICD-10-CM | POA: Diagnosis not present

## 2020-05-21 DIAGNOSIS — Z96611 Presence of right artificial shoulder joint: Secondary | ICD-10-CM | POA: Insufficient documentation

## 2020-05-21 DIAGNOSIS — R109 Unspecified abdominal pain: Secondary | ICD-10-CM | POA: Diagnosis not present

## 2020-05-21 DIAGNOSIS — K219 Gastro-esophageal reflux disease without esophagitis: Secondary | ICD-10-CM | POA: Insufficient documentation

## 2020-05-21 DIAGNOSIS — K5792 Diverticulitis of intestine, part unspecified, without perforation or abscess without bleeding: Secondary | ICD-10-CM

## 2020-05-21 LAB — URINALYSIS, ROUTINE W REFLEX MICROSCOPIC
Bilirubin Urine: NEGATIVE
Glucose, UA: NEGATIVE mg/dL
Hgb urine dipstick: NEGATIVE
Ketones, ur: NEGATIVE mg/dL
Nitrite: NEGATIVE
Protein, ur: NEGATIVE mg/dL
Specific Gravity, Urine: 1.008 (ref 1.005–1.030)
pH: 6 (ref 5.0–8.0)

## 2020-05-21 LAB — LIPASE, BLOOD: Lipase: 36 U/L (ref 11–51)

## 2020-05-21 LAB — COMPREHENSIVE METABOLIC PANEL
ALT: 13 U/L (ref 0–44)
AST: 18 U/L (ref 15–41)
Albumin: 3.7 g/dL (ref 3.5–5.0)
Alkaline Phosphatase: 88 U/L (ref 38–126)
Anion gap: 9 (ref 5–15)
BUN: 15 mg/dL (ref 8–23)
CO2: 24 mmol/L (ref 22–32)
Calcium: 9.7 mg/dL (ref 8.9–10.3)
Chloride: 101 mmol/L (ref 98–111)
Creatinine, Ser: 1.16 mg/dL — ABNORMAL HIGH (ref 0.44–1.00)
GFR, Estimated: 45 mL/min — ABNORMAL LOW (ref >60)
Glucose, Bld: 116 mg/dL — ABNORMAL HIGH (ref 70–99)
Potassium: 4.1 mmol/L (ref 3.5–5.1)
Sodium: 134 mmol/L — ABNORMAL LOW (ref 135–145)
Total Bilirubin: 0.5 mg/dL (ref 0.3–1.2)
Total Protein: 6.4 g/dL — ABNORMAL LOW (ref 6.5–8.1)

## 2020-05-21 LAB — CBC
HCT: 41 % (ref 36.0–46.0)
Hemoglobin: 13.5 g/dL (ref 12.0–15.0)
MCH: 32.5 pg (ref 26.0–34.0)
MCHC: 32.9 g/dL (ref 30.0–36.0)
MCV: 98.6 fL (ref 80.0–100.0)
Platelets: 279 10*3/uL (ref 150–400)
RBC: 4.16 MIL/uL (ref 3.87–5.11)
RDW: 12.2 % (ref 11.5–15.5)
WBC: 9.1 10*3/uL (ref 4.0–10.5)
nRBC: 0 % (ref 0.0–0.2)

## 2020-05-21 MED ORDER — IOHEXOL 300 MG/ML  SOLN
100.0000 mL | Freq: Once | INTRAMUSCULAR | Status: AC | PRN
  Administered 2020-05-21: 100 mL via INTRAVENOUS

## 2020-05-21 MED ORDER — ACETAMINOPHEN 325 MG PO TABS
650.0000 mg | ORAL_TABLET | Freq: Once | ORAL | Status: AC
  Administered 2020-05-21: 650 mg via ORAL
  Filled 2020-05-21: qty 2

## 2020-05-21 NOTE — ED Provider Notes (Signed)
[facility] EMERGENCY DEPARTMENT Provider Note   CSN: [id number] Arrival date & time: 05/21/20  1906     History Chief Complaint  Patient presents with  . Abdominal Pain    Crystal Vazquez is a 87 y.o. female.  87 year old female with a history of gastritis, hypertension presents to the emergency department for evaluation of right upper quadrant abdominal pain.  States that the pain feels like a "knot" in her side.  It has been constant since it began today and has been associated with some increased belching and passing of flatus.  Denies any known alleviating factors.  Does report some increased discomfort with breathing.  No vomiting, diarrhea, dysuria, hematuria.  No hx of abdominal surgeries.  Normal BM this AM.  The history is provided by the patient. No language interpreter was used.  Abdominal Pain      Past Medical History:  Diagnosis Date  . Arthritis   . Cancer (HCC)    SCC Scalp  . Diverticulosis of colon (without mention of hemorrhage) 2003   colonoscopy 2003  . Gastritis 2008   endoscopy 2008  . GERD (gastroesophageal reflux disease)   . Glaucoma    left eye  . Hypertension   . Insomnia   . Melanoma ([place]) 1996   on back, excised with no recurrence  . Neuropathy    feet and legs  . Restless legs syndrome (RLS)   . Urinary incontinence   . Wears glasses     Patient Active Problem List   Diagnosis Date Noted  . S/P shoulder replacement, right 02/14/2016  . Erosive pustular dermatosis 05/28/2014  . History of nonmelanoma skin cancer 05/28/2014    Past Surgical History:  Procedure Laterality Date  . ABDOMINAL HYSTERECTOMY    . APPLICATION OF A-CELL OF EXTREMITY N/A 04/03/2019   Procedure: APPLICATION OF Integra Bilayer to scalp;  Surgeon: [name], MD;  Location: [place];  Service: Plastics;  Laterality: N/A;  45 min  . COLONOSCOPY    . ESOPHAGOGASTRODUODENOSCOPY    . INCISION AND DRAINAGE OF WOUND N/A  08/15/2019   Procedure: Debridement of scalp wound and placement of Integra bilayer;  Surgeon: [name], MD;  Location: [place];  Service: Plastics;  Laterality: N/A;  . REVERSE SHOULDER ARTHROPLASTY Right 02/14/2016   Procedure: RIGHT REVERSE SHOULDER ARTHROPLASTY;  Surgeon: Netta Cedars, MD;  Location: [place];  Service: Orthopedics;  Laterality: Right;     OB History   No obstetric history on file.     Family History  Problem Relation Age of Onset  . Alzheimer's disease Sister   . Breast cancer Other        sisters, aunt  . Aneurysm Mother        AAA  . Heart disease Mother        mother, aunts, uncles had"heart problems" unknown to pt  . Hypertension Other        multiple family members  . Diabetes type II Brother     Social History   Tobacco Use  . Smoking status: Never Smoker  . Smokeless tobacco: Never Used  Substance Use Topics  . Alcohol use: No  . Drug use: No    Home Medications Prior to Admission medications   Medication Sig Start Date End Date Taking? Authorizing Provider  acetaminophen (TYLENOL) 500 MG tablet Take 500 mg by mouth every 6 (six) hours as needed for mild pain.   Yes [provider]  amLODipine (NORVASC) 5 MG tablet Take 5 mg by mouth daily.   Yes [provider]  atorvastatin (LIPITOR) 40 MG tablet Take 40 mg by mouth daily at 6 PM.    Yes [provider]  dorzolamide-timolol (COSOPT) 22.3-6.8 MG/ML ophthalmic solution Place 1 drop into both eyes in the morning and at bedtime. 03/14/20 03/14/21 Yes [provider]  gabapentin (NEURONTIN) 300 MG capsule Take 300 mg by mouth 2 (two) times daily.   Yes [provider]  ketorolac (ACULAR) 0.5 % ophthalmic solution Place 1 drop into both eyes in the morning, at noon, in the evening, and at bedtime. 08/28/19  Yes [provider]  losartan (COZAAR) 25 MG tablet Take 50 mg by mouth daily.  12/27/15  Yes [provider]   metroNIDAZOLE (FLAGYL) 500 MG tablet Take 1 tablet (500 mg total) by mouth 2 (two) times daily. 05/22/20  Yes Antonietta Breach, PA-C  omeprazole (PRILOSEC) 40 MG capsule Take 40 mg by mouth daily.   Yes [provider]  traZODone (DESYREL) 50 MG tablet Take 1 tablet by mouth at bedtime.   Yes [provider]  zolpidem (AMBIEN) 5 MG tablet Take 5 mg by mouth at bedtime.  11/23/15  Yes [provider]  ciprofloxacin (CIPRO) 500 MG tablet Take 1 tablet (500 mg total) by mouth daily. 05/22/20   Antonietta Breach, PA-C    Allergies    Norco [hydrocodone-acetaminophen], Other, Sulfa antibiotics, Sulfasalazine, Amlodipine-atorvastatin, Monosodium glutamate, Penicillins, Sulfa drugs cross reactors, Aleve [naproxen sodium], Codeine, and Red dye  Review of Systems   Review of Systems  Gastrointestinal: Positive for abdominal pain.  Ten systems reviewed and are negative for acute change, except as noted in the HPI.    Physical Exam Updated Vital Signs BP (!) 164/75   Pulse 74   Temp 97.6 F (36.4 C) (Oral)   Resp 18   Ht 5\' 4"  (1.626 m)   Wt 62.1 kg   SpO2 97%   BMI 23.52 kg/m   Physical Exam Vitals and nursing note reviewed.  Constitutional:      General: She is not in acute distress.    Appearance: She is well-developed and well-nourished. She is not diaphoretic.     Comments: Nontoxic-appearing and in no distress  HENT:     Head: Normocephalic and atraumatic.  Eyes:     General: No scleral icterus.    Extraocular Movements: EOM normal.     Conjunctiva/sclera: Conjunctivae normal.  Cardiovascular:     Rate and Rhythm: Normal rate and regular rhythm.     Pulses: Normal pulses.  Pulmonary:     Effort: Pulmonary effort is normal. No respiratory distress.     Comments: Respirations even and unlabored Chest:     Chest wall: Tenderness (Right lower lateral) present. No crepitus.  Abdominal:     General: There is no distension.     Palpations: Abdomen is soft. There  is no mass.     Tenderness: There is abdominal tenderness in the right upper quadrant. There is no guarding. Negative signs include Murphy's sign and McBurney's sign.    Musculoskeletal:        General: Normal range of motion.     Cervical back: Normal range of motion.  Skin:    General: Skin is warm and dry.     Coloration: Skin is not pale.     Findings: No erythema or rash.  Neurological:     Mental Status: She is alert  and oriented to person, place, and time.  Psychiatric:        Mood and Affect: Mood and affect normal.        Behavior: Behavior normal.     ED Results / Procedures / Treatments   Labs (all labs ordered are listed, but only abnormal results are displayed) Labs Reviewed  COMPREHENSIVE METABOLIC PANEL - Abnormal; Notable for the following components:      Result Value   Sodium 134 (*)    Glucose, Bld 116 (*)    Creatinine, Ser 1.16 (*)    Total Protein 6.4 (*)    GFR, Estimated 45 (*)    All other components within normal limits  URINALYSIS, ROUTINE W REFLEX MICROSCOPIC - Abnormal; Notable for the following components:   Color, Urine STRAW (*)    Leukocytes,Ua TRACE (*)    Bacteria, UA RARE (*)    All other components within normal limits  LIPASE, BLOOD  CBC    EKG None  Radiology CT ABDOMEN PELVIS W CONTRAST  Result Date: 05/21/2020 CLINICAL DATA:  Abdominal pain, acute, nonlocalized EXAM: CT ABDOMEN AND PELVIS WITH CONTRAST TECHNIQUE: Multidetector CT imaging of the abdomen and pelvis was performed using the standard protocol following bolus administration of intravenous contrast. CONTRAST:  126mL OMNIPAQUE IOHEXOL 300 MG/ML  SOLN COMPARISON:  None. FINDINGS: Lower chest: The visualized lung bases are clear bilaterally. Extensive calcification of the aortic valve leaflets are noted. Global cardiac size within normal limits. Small hiatal hernia. Hepatobiliary: No focal liver abnormality is seen. No gallstones, gallbladder wall thickening, or biliary  dilatation. Pancreas: Unremarkable Spleen: Unremarkable Adrenals/Urinary Tract: Adrenal glands are unremarkable. Kidneys are normal, without renal calculi, focal lesion, or hydronephrosis. Bladder is unremarkable. Stomach/Bowel: There is mild circumferential thickening and periluminal inflammatory stranding involving the gastric antrum, best appreciated on axial image # 25 in keeping with infectious or inflammatory antritis. There is extensive sigmoid diverticulosis present with mild focal inflammatory changes volume the proximal sigmoid colon in keeping with very mild acute or subacute, uncomplicated sigmoid diverticulitis. The stomach, small bowel, and large bowel are otherwise unremarkable. Appendix normal. No free intraperitoneal gas or fluid. Vascular/Lymphatic: There is extensive aortoiliac atherosclerotic calcification. No aortic aneurysm. Circumaortic left renal vein. No pathologic adenopathy within the abdomen and pelvis. Reproductive: Status post hysterectomy. No adnexal masses. Other: No abdominal wall hernia. Musculoskeletal: The osseous structures are diffusely osteopenic. No lytic or blastic bone lesions are seen. Lumbar levoscoliosis noted. Degenerative changes noted within the lumbar spine. No acute bone abnormality. IMPRESSION: Mild circumferential wall thickening and periluminal inflammatory stranding involving the gastric antrum in keeping with infectious or inflammatory antritis. Subtle inflammatory changes involving the proximal sigmoid colon in keeping with very mild acute, uncomplicated or subacute diverticulitis. Background severe sigmoid diverticulosis. Extensive calcification of the aortic valve leaflets. Echocardiography may be more helpful to assess for valvular dysfunction. Peripheral vascular disease Aortic Atherosclerosis (ICD10-I70.0). Electronically Signed   By: Fidela Salisbury MD   On: 05/21/2020 23:57    Procedures Procedures   Medications Ordered in ED Medications   acetaminophen (TYLENOL) tablet 650 mg (650 mg Oral Given 05/21/20 2321)  iohexol (OMNIPAQUE) 300 MG/ML solution 100 mL (100 mLs Intravenous Contrast Given 05/21/20 2343)    ED Course  I have reviewed the triage vital signs and the nursing notes.  Pertinent labs & imaging results that were available during my care of the patient were reviewed by me and considered in my medical decision making (see chart for details).  MDM Rules/Calculators/A&P                          87 year old female presents to the emergency department for complaints of abdominal pain.  Symptoms began today and have persisted.  She had relief of her discomfort in the ED after receiving Tylenol.  Labs and imaging completed.  CT suggestive of gastric antrum thickening, inflammatory versus infectious.  There is also question of acute versus subacute sigmoid diverticulitis.  Plan for discharge on ciprofloxacin and Flagyl.  She has been instructed to take Tylenol for management of pain at home.  Encouraged follow-up with her primary care doctor, but will also refer to gastroenterology should symptoms persist.  Return precautions discussed and provided. Patient discharged in stable condition with no unaddressed concerns.   Final Clinical Impression(s) / ED Diagnoses Final diagnoses:  Acute gastritis, presence of bleeding unspecified, unspecified gastritis type  Diverticulitis    Rx / DC Orders ED Discharge Orders         Ordered    ciprofloxacin (CIPRO) 500 MG tablet  2 times daily,   Status:  Discontinued        05/22/20 0108    metroNIDAZOLE (FLAGYL) 500 MG tablet  2 times daily        05/22/20 0108    ciprofloxacin (CIPRO) 500 MG tablet  Daily        05/22/20 0112           Antonietta Breach, PA-C 05/22/20 0300    Fatima Blank, MD 05/22/20 [phone]

## 2020-05-21 NOTE — ED Triage Notes (Signed)
Patient reports RUQ pain, reports it is intermittent but tonight it got worse, concerned about her gallbladder.

## 2020-05-21 NOTE — ED Notes (Signed)
Pt ambulated to the restroom w/o assistance

## 2020-05-22 MED ORDER — CIPROFLOXACIN HCL 500 MG PO TABS: 500.0000 mg | ORAL_TABLET | Freq: Two times a day (BID) | ORAL | 0 refills | Status: DC

## 2020-05-22 MED ORDER — METRONIDAZOLE 500 MG PO TABS: 500.0000 mg | ORAL_TABLET | Freq: Two times a day (BID) | ORAL | 0 refills | Status: DC

## 2020-05-22 MED ORDER — CIPROFLOXACIN HCL 500 MG PO TABS: 500.0000 mg | ORAL_TABLET | Freq: Every day | ORAL | 0 refills | Status: DC

## 2020-05-22 NOTE — Discharge Instructions (Addendum)
You have some thickening of your stomach which is suspected to be the cause of your pain today.  There is also evidence of some thickening to your colon.  You have been started on antibiotics for management of this.    Take as follows: Ciprofloxacin 500mg  (1 tablet) daily for 10 days Flagyl 500mg  (1 tablet) twice a day for 10 days  We recommend use of a daily probiotic for the next two weeks as a side effect of antibiotics is diarrhea.  Probiotics can be purchased over-the-counter at your local pharmacy.  You may take Tylenol for management of pain.  Use as instructed on the box/bottle.  Follow-up with your primary care doctor to ensure that symptoms resolve.  You have been given a referral to gastroenterology should you require further work-up.

## 2020-05-23 ENCOUNTER — Telehealth: Payer: Self-pay

## 2020-05-23 NOTE — Telephone Encounter (Addendum)
Patient called to say we were going to arrange to have a home health nurse come to see her.  She wants to let us know that she has not heard from them yet.  Please call.  Donella Stade called from [facility] to let us know that she received a referral from [place] for [facility].  Delle Reining said that she is unable to accept the referral because she does not have the staff to handle it.  Please call her at [phone] if you have any questions.

## 2020-05-23 NOTE — Telephone Encounter (Signed)
Returned patients call. Advised one [facility] [facility] was out of her network, [facility] could not help due to low staffing. Waiting to hear back from [facility]. Will follow up with them tomorrow and patient as well.

## 2020-05-28 ENCOUNTER — Telehealth: Payer: Self-pay

## 2020-05-28 NOTE — Telephone Encounter (Signed)
Patient called to state that she is out of Xeroform and would like an update on home health.

## 2020-05-28 NOTE — Telephone Encounter (Signed)
[place] to follow up on [facility] referral that was sent 05/23/20. Spoke with Benjamine Mola and was advised [place] who is the marketing rep was supposed to have called our office to let us there is a "hold on the area" (no staff available). She is sending an e-mail to her along with her manager, Geraldo Docker. She is hoping that [facility], will be able to find some help and will call back today to let us know whether they can or not assist [place].  [facility] is out of patients network and [facility] does not have the staffing to cover.

## 2020-05-28 NOTE — Telephone Encounter (Signed)
Returned [facility] Verified they were understaffed and were not able to help.

## 2020-05-28 NOTE — Telephone Encounter (Signed)
Returned patients call. Advised there are no HHC facilities available to help with her dressing changes and are all understaffed. She is requesting an order of Xeroform. She does not need 4x4 guaze, or tape. The Mepilex border form they we used on her at the last visit irritated her skin. Will call [facility] for more Xeroform.

## 2020-05-28 NOTE — Telephone Encounter (Signed)
[place] from [place] called [facility] regarding a referral for the patient.  She said unfortunately, that area is on-hold for them and they are unable to get a nurse out there.  Please call her if you have any questions.

## 2020-05-30 DIAGNOSIS — L988 Other specified disorders of the skin and subcutaneous tissue: Secondary | ICD-10-CM | POA: Diagnosis not present

## 2020-05-30 NOTE — Telephone Encounter (Signed)
Called [facility], her xeroform order has expired. Faxed a new order.

## 2020-07-01 DIAGNOSIS — H3530 Unspecified macular degeneration: Secondary | ICD-10-CM | POA: Diagnosis not present

## 2020-07-01 DIAGNOSIS — E785 Hyperlipidemia, unspecified: Secondary | ICD-10-CM | POA: Diagnosis not present

## 2020-07-01 DIAGNOSIS — G8929 Other chronic pain: Secondary | ICD-10-CM | POA: Diagnosis not present

## 2020-07-01 DIAGNOSIS — G629 Polyneuropathy, unspecified: Secondary | ICD-10-CM | POA: Diagnosis not present

## 2020-07-01 DIAGNOSIS — K08409 Partial loss of teeth, unspecified cause, unspecified class: Secondary | ICD-10-CM | POA: Diagnosis not present

## 2020-07-01 DIAGNOSIS — K5900 Constipation, unspecified: Secondary | ICD-10-CM | POA: Diagnosis not present

## 2020-07-01 DIAGNOSIS — I10 Essential (primary) hypertension: Secondary | ICD-10-CM | POA: Diagnosis not present

## 2020-07-01 DIAGNOSIS — K219 Gastro-esophageal reflux disease without esophagitis: Secondary | ICD-10-CM | POA: Diagnosis not present

## 2020-07-01 DIAGNOSIS — G4700 Insomnia, unspecified: Secondary | ICD-10-CM | POA: Diagnosis not present

## 2020-07-01 DIAGNOSIS — H409 Unspecified glaucoma: Secondary | ICD-10-CM | POA: Diagnosis not present

## 2020-07-23 ENCOUNTER — Ambulatory Visit (INDEPENDENT_AMBULATORY_CARE_PROVIDER_SITE_OTHER): Payer: Medicare HMO | Admitting: Surgical

## 2020-07-23 ENCOUNTER — Encounter: Payer: Self-pay | Admitting: Surgical

## 2020-07-23 ENCOUNTER — Other Ambulatory Visit: Payer: Self-pay

## 2020-07-23 DIAGNOSIS — C4442 Squamous cell carcinoma of skin of scalp and neck: Secondary | ICD-10-CM

## 2020-07-23 NOTE — Progress Notes (Signed)
   Subjective:     Patient ID: Crystal Vazquez, female    DOB: 09/12/1926, 87 y.o.   MRN: [id number]  Chief Complaint  Patient presents with  . Follow-up    HPI: The patient is a 87 y.o. female here with her son for follow-up on her scalp wound.  She has a history of radiation to this area.  She has been dealing with this for quite some time and it is healing quite slowly due to the location and her history of radiation.  She reports that overall she is doing well.  We discussed today that we have tried to get home health for her, however the home health that accepts her insurance is short staffed and were unable to provide care for her.  Our wound care has been limited due to her inability to apply complex dressings to this area.  Therefore we have continued with Xeroform to the area for quite some time.  The area has improved and then subsequently worsened over the past few months.  She is unable to receive any assistance from family as very few of her family members live nearby.  She is not having any infectious symptoms.  She has no complaints today.   Review of Systems  Constitutional: Negative.   Skin: Positive for color change and wound.     Objective:   Vital Signs There were no vitals taken for this visit. Vital Signs and Nursing Note Reviewed Physical Exam Constitutional:      General: She is not in acute distress.    Appearance: Normal appearance. She is not ill-appearing.     Comments: Elderly female  HENT:     Head: Atraumatic.   Neurological:     Mental Status: She is alert.         Assessment/Plan:     ICD-10-CM   1. SCC (squamous cell carcinoma), scalp/neck  C44.42     Donated ACell and collagen was placed over the patient's scalp wound today.  I recommend leaving this in place for 2 days and then beginning with Xeroform dressing changes.  Patient has many barriers to care including no assistance at home.  We have tried to receive assistance from  home health, however they were unable to provide care as they were short staffed.  We are unable to see the patient as frequent as we would like as she does not have transportation as she has very minimal family members if any that live in the area.  Her son travels here from [place] for her visits and this has been very difficult for him due to the length of travel.  We have discussed that her history of radiation to this area has significantly contributed to the poor healing.  We have discussed that radiation damage can slow healing and with her age and location of the wound she also has these factors likely causing some delay in her healing process.    Recommend calling with questions or concerns. Follow up scheduled for 2 to 3 weeks. I discussed with her today that we would reach out to home health care again and see if they are able to provide any assistance for her with wound care.   Carola Rhine Ayannah Faddis, PA-C 07/23/2020, 11:21 AM

## 2020-07-24 ENCOUNTER — Telehealth: Payer: Self-pay

## 2020-07-24 NOTE — Telephone Encounter (Signed)
Called Tiffany with [facility], LMVM if they had availability for wound care. Faxed referral to [facility].

## 2020-07-25 ENCOUNTER — Encounter: Payer: Self-pay | Admitting: Podiatry

## 2020-07-25 ENCOUNTER — Ambulatory Visit (INDEPENDENT_AMBULATORY_CARE_PROVIDER_SITE_OTHER): Payer: Medicare HMO

## 2020-07-25 ENCOUNTER — Ambulatory Visit (INDEPENDENT_AMBULATORY_CARE_PROVIDER_SITE_OTHER): Payer: Medicare HMO | Admitting: Podiatry

## 2020-07-25 ENCOUNTER — Other Ambulatory Visit: Payer: Self-pay

## 2020-07-25 DIAGNOSIS — B351 Tinea unguium: Secondary | ICD-10-CM | POA: Diagnosis not present

## 2020-07-25 DIAGNOSIS — M722 Plantar fascial fibromatosis: Secondary | ICD-10-CM | POA: Diagnosis not present

## 2020-07-25 DIAGNOSIS — M79674 Pain in right toe(s): Secondary | ICD-10-CM

## 2020-07-25 DIAGNOSIS — M79675 Pain in left toe(s): Secondary | ICD-10-CM

## 2020-07-25 MED ORDER — TRIAMCINOLONE ACETONIDE 10 MG/ML IJ SUSP
10.0000 mg | Freq: Once | INTRAMUSCULAR | Status: AC
  Administered 2020-07-25: 10 mg

## 2020-07-26 NOTE — Progress Notes (Signed)
Subjective:   Patient ID: Crystal Vazquez, female   DOB: 87 y.o.   MRN: [id number]   HPI Patient states she developed a lot of pain in the bottom of her left heel and it is making it difficult for her to bear weight.  States it started around a month ago and she does not remember injury.  Patient does not smoke and is not particularly active   Review of Systems  All other systems reviewed and are negative.       Objective:  Physical Exam Vitals and nursing note reviewed.  Constitutional:      Appearance: She is well-developed.  Pulmonary:     Effort: Pulmonary effort is normal.  Musculoskeletal:        General: Normal range of motion.  Skin:    General: Skin is warm.  Neurological:     Mental Status: She is alert.     Neurovascular status intact muscle strength was found to be adequate range of motion found to be adequate.  Patient is noted to have exquisite discomfort in the plantar aspect of the left heel with inflammation fluid buildup around the medial band at its insertion into the calcaneus.  She has a fairly thin foot structure and has a moderate cavus type foot and does have mild equinus     Assessment:  Acute plantar fasciitis left plantar heel insertional point tendon calcaneus     Plan:  H&P x-ray reviewed condition discussed and went ahead today did sterile prep and injected the fascia at insertion calcaneus 3 mg Dexasone Kenalog 5 mg Xylocaine and went ahead and applied fascial brace to lift up the arch with supportive shoes and cushions recommended.  Patient will be seen back to recheck  X-rays indicate there is a cavus foot structure no indication of advanced osteoarthritis advanced arthritis noted

## 2020-07-29 ENCOUNTER — Telehealth: Payer: Self-pay

## 2020-07-29 NOTE — Telephone Encounter (Addendum)
Called Lisa w/[facility]. LMVM to see if they have availability for [facility] for wound care. Received phone call from [facility], they do not have the staff for the [place] area.   08/05/20, Eliezer Lofts with [facility]- LMVM for [place] status

## 2020-07-29 NOTE — Telephone Encounter (Signed)
error 

## 2020-08-05 ENCOUNTER — Telehealth: Payer: Self-pay

## 2020-08-05 NOTE — Telephone Encounter (Signed)
Meg called from [facility] for Crystal Vazquez.  Meg said that she needs to verify that they are in network with Crystal Vazquez insurance and it may be tomorrow before she finds out.  She will let you know as soon as she has the information.

## 2020-08-09 ENCOUNTER — Telehealth: Payer: Self-pay | Admitting: Surgical

## 2020-08-09 NOTE — Telephone Encounter (Signed)
Faxed order to [facility] for wound care to scalp. Collagen, gauze, ABD pads, and tape. Change every other day

## 2020-08-09 NOTE — Telephone Encounter (Signed)
Meg with [facility] called to request wound care orders to be faxed so they can verify with insurance. Please fax info to [phone]. Her number if she needs to be reached [phone]

## 2020-08-13 ENCOUNTER — Telehealth: Payer: Self-pay

## 2020-08-13 NOTE — Telephone Encounter (Signed)
Meg from [place] called to state that they are out of network with the patient's insurance. She has referred the patient to [facility]. Meg can be reached at [phone]

## 2020-08-14 ENCOUNTER — Other Ambulatory Visit: Payer: Self-pay

## 2020-08-14 ENCOUNTER — Ambulatory Visit: Payer: Medicare HMO | Admitting: Plastic Surgery

## 2020-08-14 DIAGNOSIS — C4442 Squamous cell carcinoma of skin of scalp and neck: Secondary | ICD-10-CM | POA: Diagnosis not present

## 2020-08-14 NOTE — Telephone Encounter (Signed)
Received fax from Jiles Harold with [facility]. They are able to service Crystal Vazquez. Patient is aware of service.

## 2020-08-14 NOTE — Progress Notes (Signed)
   Referring Provider Cyndi Bender, PA-C [place],  [place] [address]   CC:  Chief Complaint  Patient presents with  . Follow-up      Crystal Vazquez is an 87 y.o. female.  HPI: Patient presents with a chronic wound of her scalp.  She has had multiple skin cancer excisions of her scalp to which I applied Integra for her.  She is done well overall and has no exposed bone but has been nursing some open wounds for quite some time.  She is currently doing Xeroform and a soft dressing which she is tolerating fine.  Home health has been approved to help her with wound care starting in the next week or so.  Review of Systems General: Denies fevers or chills  Physical Exam Vitals with BMI 05/22/2020 05/22/2020 05/21/2020  Height - - -  Weight - - -  BMI - - -  Systolic 121 975 883  Diastolic 75 75 80  Pulse 74 76 89    General:  No acute distress,  Alert and oriented, Non-Toxic, Normal speech and affect Scalp: There are still a few areas that have yet to epithelialize.  It does look to have gotten smaller overall.  There is no signs of infection there is no exposed bone.  Overall it looks to be slowly improving.  Assessment/Plan Patient is doing well with her chronic wound of her scalp.  I explained the challenges in getting this tissue to heal given the history of radiation and multiple surgical procedures.  I do not think that she would be a good candidate for a skin graft and I do not think that any more aggressive surgical measures would be warranted in her case and she seems to agree.  We will continue with conservative measures with Xeroform dressings and I will see her again in around 4 months time.  All of her questions were answered.  [name] 08/14/2020, 11:47 AM

## 2020-08-19 DIAGNOSIS — H401132 Primary open-angle glaucoma, bilateral, moderate stage: Secondary | ICD-10-CM | POA: Diagnosis not present

## 2020-08-19 DIAGNOSIS — Z961 Presence of intraocular lens: Secondary | ICD-10-CM | POA: Diagnosis not present

## 2020-08-20 DIAGNOSIS — Z4800 Encounter for change or removal of nonsurgical wound dressing: Secondary | ICD-10-CM | POA: Diagnosis not present

## 2020-08-20 DIAGNOSIS — K219 Gastro-esophageal reflux disease without esophagitis: Secondary | ICD-10-CM | POA: Diagnosis not present

## 2020-08-20 DIAGNOSIS — G2581 Restless legs syndrome: Secondary | ICD-10-CM | POA: Diagnosis not present

## 2020-08-20 DIAGNOSIS — L7682 Other postprocedural complications of skin and subcutaneous tissue: Secondary | ICD-10-CM | POA: Diagnosis not present

## 2020-08-20 DIAGNOSIS — I10 Essential (primary) hypertension: Secondary | ICD-10-CM | POA: Diagnosis not present

## 2020-08-20 DIAGNOSIS — C4442 Squamous cell carcinoma of skin of scalp and neck: Secondary | ICD-10-CM | POA: Diagnosis not present

## 2020-08-20 DIAGNOSIS — H409 Unspecified glaucoma: Secondary | ICD-10-CM | POA: Diagnosis not present

## 2020-08-20 DIAGNOSIS — G8929 Other chronic pain: Secondary | ICD-10-CM | POA: Diagnosis not present

## 2020-08-20 DIAGNOSIS — G4700 Insomnia, unspecified: Secondary | ICD-10-CM | POA: Diagnosis not present

## 2020-08-20 DIAGNOSIS — Z923 Personal history of irradiation: Secondary | ICD-10-CM | POA: Diagnosis not present

## 2020-08-21 DIAGNOSIS — E7800 Pure hypercholesterolemia, unspecified: Secondary | ICD-10-CM | POA: Diagnosis not present

## 2020-08-21 DIAGNOSIS — M545 Low back pain, unspecified: Secondary | ICD-10-CM | POA: Diagnosis not present

## 2020-08-21 DIAGNOSIS — Z139 Encounter for screening, unspecified: Secondary | ICD-10-CM | POA: Diagnosis not present

## 2020-08-21 DIAGNOSIS — L988 Other specified disorders of the skin and subcutaneous tissue: Secondary | ICD-10-CM | POA: Diagnosis not present

## 2020-08-21 DIAGNOSIS — G629 Polyneuropathy, unspecified: Secondary | ICD-10-CM | POA: Diagnosis not present

## 2020-08-21 DIAGNOSIS — K2970 Gastritis, unspecified, without bleeding: Secondary | ICD-10-CM | POA: Diagnosis not present

## 2020-08-21 DIAGNOSIS — K219 Gastro-esophageal reflux disease without esophagitis: Secondary | ICD-10-CM | POA: Diagnosis not present

## 2020-08-21 DIAGNOSIS — G4700 Insomnia, unspecified: Secondary | ICD-10-CM | POA: Diagnosis not present

## 2020-08-21 DIAGNOSIS — I10 Essential (primary) hypertension: Secondary | ICD-10-CM | POA: Diagnosis not present

## 2020-08-21 DIAGNOSIS — G2581 Restless legs syndrome: Secondary | ICD-10-CM | POA: Diagnosis not present

## 2020-09-18 DIAGNOSIS — H409 Unspecified glaucoma: Secondary | ICD-10-CM | POA: Diagnosis not present

## 2020-09-18 DIAGNOSIS — Z4800 Encounter for change or removal of nonsurgical wound dressing: Secondary | ICD-10-CM | POA: Diagnosis not present

## 2020-09-18 DIAGNOSIS — C4442 Squamous cell carcinoma of skin of scalp and neck: Secondary | ICD-10-CM | POA: Diagnosis not present

## 2020-09-18 DIAGNOSIS — G4700 Insomnia, unspecified: Secondary | ICD-10-CM | POA: Diagnosis not present

## 2020-09-18 DIAGNOSIS — G8929 Other chronic pain: Secondary | ICD-10-CM | POA: Diagnosis not present

## 2020-09-18 DIAGNOSIS — G2581 Restless legs syndrome: Secondary | ICD-10-CM | POA: Diagnosis not present

## 2020-09-18 DIAGNOSIS — I10 Essential (primary) hypertension: Secondary | ICD-10-CM | POA: Diagnosis not present

## 2020-09-18 DIAGNOSIS — L7682 Other postprocedural complications of skin and subcutaneous tissue: Secondary | ICD-10-CM | POA: Diagnosis not present

## 2020-09-18 DIAGNOSIS — Z923 Personal history of irradiation: Secondary | ICD-10-CM | POA: Diagnosis not present

## 2020-09-18 DIAGNOSIS — K219 Gastro-esophageal reflux disease without esophagitis: Secondary | ICD-10-CM | POA: Diagnosis not present

## 2020-09-19 DIAGNOSIS — H401132 Primary open-angle glaucoma, bilateral, moderate stage: Secondary | ICD-10-CM | POA: Diagnosis not present

## 2020-09-19 DIAGNOSIS — Z961 Presence of intraocular lens: Secondary | ICD-10-CM | POA: Diagnosis not present

## 2020-09-24 DIAGNOSIS — Z4800 Encounter for change or removal of nonsurgical wound dressing: Secondary | ICD-10-CM | POA: Diagnosis not present

## 2020-09-24 DIAGNOSIS — Z923 Personal history of irradiation: Secondary | ICD-10-CM | POA: Diagnosis not present

## 2020-09-24 DIAGNOSIS — I10 Essential (primary) hypertension: Secondary | ICD-10-CM | POA: Diagnosis not present

## 2020-09-24 DIAGNOSIS — G4700 Insomnia, unspecified: Secondary | ICD-10-CM | POA: Diagnosis not present

## 2020-09-24 DIAGNOSIS — G8929 Other chronic pain: Secondary | ICD-10-CM | POA: Diagnosis not present

## 2020-09-24 DIAGNOSIS — H409 Unspecified glaucoma: Secondary | ICD-10-CM | POA: Diagnosis not present

## 2020-09-24 DIAGNOSIS — C4442 Squamous cell carcinoma of skin of scalp and neck: Secondary | ICD-10-CM | POA: Diagnosis not present

## 2020-09-24 DIAGNOSIS — G2581 Restless legs syndrome: Secondary | ICD-10-CM | POA: Diagnosis not present

## 2020-09-24 DIAGNOSIS — K219 Gastro-esophageal reflux disease without esophagitis: Secondary | ICD-10-CM | POA: Diagnosis not present

## 2020-09-24 DIAGNOSIS — L7682 Other postprocedural complications of skin and subcutaneous tissue: Secondary | ICD-10-CM | POA: Diagnosis not present

## 2020-10-01 DIAGNOSIS — Z923 Personal history of irradiation: Secondary | ICD-10-CM | POA: Diagnosis not present

## 2020-10-01 DIAGNOSIS — Z4800 Encounter for change or removal of nonsurgical wound dressing: Secondary | ICD-10-CM | POA: Diagnosis not present

## 2020-10-01 DIAGNOSIS — H409 Unspecified glaucoma: Secondary | ICD-10-CM | POA: Diagnosis not present

## 2020-10-01 DIAGNOSIS — G8929 Other chronic pain: Secondary | ICD-10-CM | POA: Diagnosis not present

## 2020-10-01 DIAGNOSIS — K219 Gastro-esophageal reflux disease without esophagitis: Secondary | ICD-10-CM | POA: Diagnosis not present

## 2020-10-01 DIAGNOSIS — I10 Essential (primary) hypertension: Secondary | ICD-10-CM | POA: Diagnosis not present

## 2020-10-01 DIAGNOSIS — G4700 Insomnia, unspecified: Secondary | ICD-10-CM | POA: Diagnosis not present

## 2020-10-01 DIAGNOSIS — C4442 Squamous cell carcinoma of skin of scalp and neck: Secondary | ICD-10-CM | POA: Diagnosis not present

## 2020-10-01 DIAGNOSIS — G2581 Restless legs syndrome: Secondary | ICD-10-CM | POA: Diagnosis not present

## 2020-10-01 DIAGNOSIS — L7682 Other postprocedural complications of skin and subcutaneous tissue: Secondary | ICD-10-CM | POA: Diagnosis not present

## 2020-10-08 DIAGNOSIS — H409 Unspecified glaucoma: Secondary | ICD-10-CM | POA: Diagnosis not present

## 2020-10-08 DIAGNOSIS — L7682 Other postprocedural complications of skin and subcutaneous tissue: Secondary | ICD-10-CM | POA: Diagnosis not present

## 2020-10-08 DIAGNOSIS — K219 Gastro-esophageal reflux disease without esophagitis: Secondary | ICD-10-CM | POA: Diagnosis not present

## 2020-10-08 DIAGNOSIS — C4442 Squamous cell carcinoma of skin of scalp and neck: Secondary | ICD-10-CM | POA: Diagnosis not present

## 2020-10-08 DIAGNOSIS — I10 Essential (primary) hypertension: Secondary | ICD-10-CM | POA: Diagnosis not present

## 2020-10-08 DIAGNOSIS — G4700 Insomnia, unspecified: Secondary | ICD-10-CM | POA: Diagnosis not present

## 2020-10-08 DIAGNOSIS — Z4800 Encounter for change or removal of nonsurgical wound dressing: Secondary | ICD-10-CM | POA: Diagnosis not present

## 2020-10-08 DIAGNOSIS — Z923 Personal history of irradiation: Secondary | ICD-10-CM | POA: Diagnosis not present

## 2020-10-08 DIAGNOSIS — G2581 Restless legs syndrome: Secondary | ICD-10-CM | POA: Diagnosis not present

## 2020-10-08 DIAGNOSIS — G8929 Other chronic pain: Secondary | ICD-10-CM | POA: Diagnosis not present

## 2020-10-15 DIAGNOSIS — K219 Gastro-esophageal reflux disease without esophagitis: Secondary | ICD-10-CM | POA: Diagnosis not present

## 2020-10-15 DIAGNOSIS — Z923 Personal history of irradiation: Secondary | ICD-10-CM | POA: Diagnosis not present

## 2020-10-15 DIAGNOSIS — Z4800 Encounter for change or removal of nonsurgical wound dressing: Secondary | ICD-10-CM | POA: Diagnosis not present

## 2020-10-15 DIAGNOSIS — L7682 Other postprocedural complications of skin and subcutaneous tissue: Secondary | ICD-10-CM | POA: Diagnosis not present

## 2020-10-15 DIAGNOSIS — C4442 Squamous cell carcinoma of skin of scalp and neck: Secondary | ICD-10-CM | POA: Diagnosis not present

## 2020-10-15 DIAGNOSIS — G2581 Restless legs syndrome: Secondary | ICD-10-CM | POA: Diagnosis not present

## 2020-10-15 DIAGNOSIS — I10 Essential (primary) hypertension: Secondary | ICD-10-CM | POA: Diagnosis not present

## 2020-10-15 DIAGNOSIS — H409 Unspecified glaucoma: Secondary | ICD-10-CM | POA: Diagnosis not present

## 2020-10-15 DIAGNOSIS — G8929 Other chronic pain: Secondary | ICD-10-CM | POA: Diagnosis not present

## 2020-10-15 DIAGNOSIS — G4700 Insomnia, unspecified: Secondary | ICD-10-CM | POA: Diagnosis not present

## 2020-11-14 DIAGNOSIS — G629 Polyneuropathy, unspecified: Secondary | ICD-10-CM | POA: Diagnosis not present

## 2020-11-14 DIAGNOSIS — Z6824 Body mass index (BMI) 24.0-24.9, adult: Secondary | ICD-10-CM | POA: Diagnosis not present

## 2020-11-28 DIAGNOSIS — H401132 Primary open-angle glaucoma, bilateral, moderate stage: Secondary | ICD-10-CM | POA: Diagnosis not present

## 2020-11-28 DIAGNOSIS — Z961 Presence of intraocular lens: Secondary | ICD-10-CM | POA: Diagnosis not present

## 2020-12-18 ENCOUNTER — Ambulatory Visit: Payer: Medicare HMO | Admitting: Plastic Surgery

## 2020-12-18 ENCOUNTER — Encounter: Payer: Self-pay | Admitting: Plastic Surgery

## 2020-12-18 ENCOUNTER — Other Ambulatory Visit: Payer: Self-pay

## 2020-12-18 DIAGNOSIS — C4442 Squamous cell carcinoma of skin of scalp and neck: Secondary | ICD-10-CM

## 2020-12-18 NOTE — Progress Notes (Signed)
Patient presents postop after Integra placement to the scalp.  She is worried about some scabs that are formed.  She otherwise feels well.  On exam she has several large scabs that have formed on her scalp.  These were debrided with pickups and scissors.  There was a small amount of fluid beneath them but this revealed largely healed epithelialized wounds beneath.  There is 2-3 areas that are still open but these are all less than 2 cm in size.  Overall this is reasonable progress for her given her starting point.  No obvious signs of infection.  No obvious signs of cancer recurrence.  We will plan to apply Vaseline to those open areas and see her again in a few months.  All of her questions were answered.

## 2021-01-02 DIAGNOSIS — Z9181 History of falling: Secondary | ICD-10-CM | POA: Diagnosis not present

## 2021-01-02 DIAGNOSIS — Z1331 Encounter for screening for depression: Secondary | ICD-10-CM | POA: Diagnosis not present

## 2021-01-02 DIAGNOSIS — E785 Hyperlipidemia, unspecified: Secondary | ICD-10-CM | POA: Diagnosis not present

## 2021-01-02 DIAGNOSIS — Z0000 Encounter for general adult medical examination without abnormal findings: Secondary | ICD-10-CM | POA: Diagnosis not present

## 2021-01-07 DIAGNOSIS — N3000 Acute cystitis without hematuria: Secondary | ICD-10-CM | POA: Diagnosis not present

## 2021-01-07 DIAGNOSIS — R300 Dysuria: Secondary | ICD-10-CM | POA: Diagnosis not present

## 2021-02-12 DIAGNOSIS — L570 Actinic keratosis: Secondary | ICD-10-CM | POA: Diagnosis not present

## 2021-02-12 DIAGNOSIS — C44329 Squamous cell carcinoma of skin of other parts of face: Secondary | ICD-10-CM | POA: Diagnosis not present

## 2021-02-12 DIAGNOSIS — L080 Pyoderma: Secondary | ICD-10-CM | POA: Diagnosis not present

## 2021-02-12 DIAGNOSIS — D492 Neoplasm of unspecified behavior of bone, soft tissue, and skin: Secondary | ICD-10-CM | POA: Diagnosis not present

## 2021-02-25 DIAGNOSIS — K2970 Gastritis, unspecified, without bleeding: Secondary | ICD-10-CM | POA: Diagnosis not present

## 2021-02-25 DIAGNOSIS — M545 Low back pain, unspecified: Secondary | ICD-10-CM | POA: Diagnosis not present

## 2021-02-25 DIAGNOSIS — G2581 Restless legs syndrome: Secondary | ICD-10-CM | POA: Diagnosis not present

## 2021-02-25 DIAGNOSIS — G629 Polyneuropathy, unspecified: Secondary | ICD-10-CM | POA: Diagnosis not present

## 2021-02-25 DIAGNOSIS — K219 Gastro-esophageal reflux disease without esophagitis: Secondary | ICD-10-CM | POA: Diagnosis not present

## 2021-02-25 DIAGNOSIS — E7800 Pure hypercholesterolemia, unspecified: Secondary | ICD-10-CM | POA: Diagnosis not present

## 2021-02-25 DIAGNOSIS — I10 Essential (primary) hypertension: Secondary | ICD-10-CM | POA: Diagnosis not present

## 2021-02-25 DIAGNOSIS — G4700 Insomnia, unspecified: Secondary | ICD-10-CM | POA: Diagnosis not present

## 2021-02-25 DIAGNOSIS — Z23 Encounter for immunization: Secondary | ICD-10-CM | POA: Diagnosis not present

## 2021-02-25 DIAGNOSIS — L988 Other specified disorders of the skin and subcutaneous tissue: Secondary | ICD-10-CM | POA: Diagnosis not present

## 2021-03-19 ENCOUNTER — Ambulatory Visit: Payer: Medicare HMO | Admitting: Plastic Surgery

## 2021-03-19 ENCOUNTER — Other Ambulatory Visit: Payer: Self-pay

## 2021-03-19 DIAGNOSIS — L080 Pyoderma: Secondary | ICD-10-CM | POA: Diagnosis not present

## 2021-03-19 DIAGNOSIS — C4442 Squamous cell carcinoma of skin of scalp and neck: Secondary | ICD-10-CM

## 2021-03-19 DIAGNOSIS — D485 Neoplasm of uncertain behavior of skin: Secondary | ICD-10-CM | POA: Diagnosis not present

## 2021-03-19 DIAGNOSIS — L570 Actinic keratosis: Secondary | ICD-10-CM | POA: Diagnosis not present

## 2021-03-19 NOTE — Progress Notes (Signed)
° °  Referring Provider Cyndi Bender, PA-C [place],  [place] [id number]   CC:  Chief Complaint  Patient presents with   Follow-up      Crystal Vazquez is an 88 y.o. female.  HPI: Patient presents for evaluation of her chronic scalp wounds.  These had been coming along and she feels like they are mostly healed at this point.  There is a few residual scabs but she no longer performs dressing changes.  She does have a new spot on the left temple that skin to be excised later today by her dermatologist.  Review of Systems General: Denies fevers and chills  Physical Exam Vitals with BMI 05/22/2020 05/22/2020 05/21/2020  Height - - -  Weight - - -  BMI - - -  Systolic 518 841 660  Diastolic 75 75 80  Pulse 74 76 89    General:  No acute distress,  Alert and oriented, Non-Toxic, Normal speech and affect Examination shows what looks like total epithelialization of the scalp vertex wounds that we have been following.  There is a few hyperkeratotic areas and some small areas of scabbing and irritation but no open wounds.  There is a 2 cm lesion in the left temple that is going to be excised later today.  Assessment/Plan Patient is done well after extensive Mohs resections and Integra placement on her scalp vertex which have mostly healed at this point.  She prefers to just follow-up with her dermatologist and will call us if she has any further concerns.  All of her questions were answered.  [name] 03/19/2021, 11:16 AM

## 2021-03-24 DIAGNOSIS — C44329 Squamous cell carcinoma of skin of other parts of face: Secondary | ICD-10-CM | POA: Diagnosis not present

## 2021-04-25 DIAGNOSIS — L905 Scar conditions and fibrosis of skin: Secondary | ICD-10-CM | POA: Diagnosis not present

## 2021-05-06 DIAGNOSIS — L570 Actinic keratosis: Secondary | ICD-10-CM | POA: Diagnosis not present

## 2021-05-06 DIAGNOSIS — L080 Pyoderma: Secondary | ICD-10-CM | POA: Diagnosis not present

## 2021-05-19 DIAGNOSIS — Z961 Presence of intraocular lens: Secondary | ICD-10-CM | POA: Diagnosis not present

## 2021-05-19 DIAGNOSIS — H401132 Primary open-angle glaucoma, bilateral, moderate stage: Secondary | ICD-10-CM | POA: Diagnosis not present

## 2021-06-19 IMAGING — CT CT ABD-PELV W/ CM
3 of 5 series · 11 of 46 positions shown, 16 images · IV contrast (APPLIED)
Comparison: None.

CLINICAL DATA: Abdominal pain, acute, nonlocalized

EXAM:
CT ABDOMEN AND PELVIS WITH CONTRAST
TECHNIQUE: Multidetector CT imaging of the abdomen and pelvis was performed
using the standard protocol following bolus administration of
intravenous contrast.
CONTRAST:  100mL OMNIPAQUE IOHEXOL 300 MG/ML  SOLN

[Series 3: abdomen 5.0 · axial · 0.72mm/px · z∈[+808,+1133]mm · 7 of 87 slices shown, 12 images]
[im 11/87  soft-tissue]
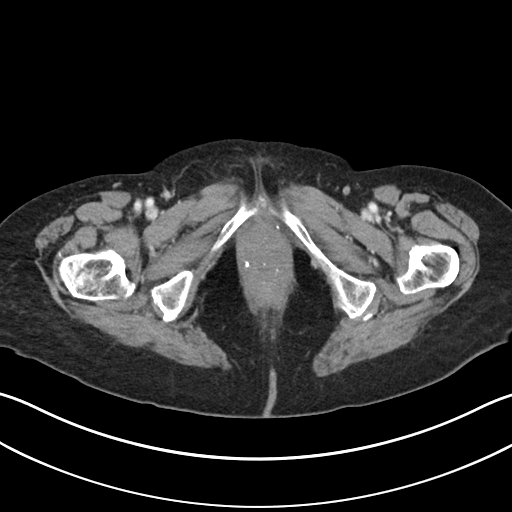
[im 11/87  bone]
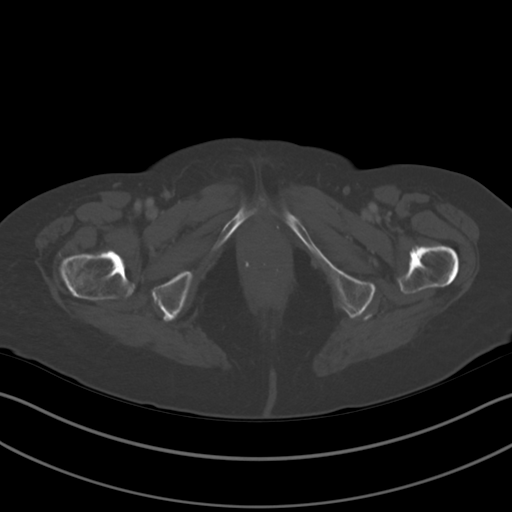
[im 22/87  soft-tissue]
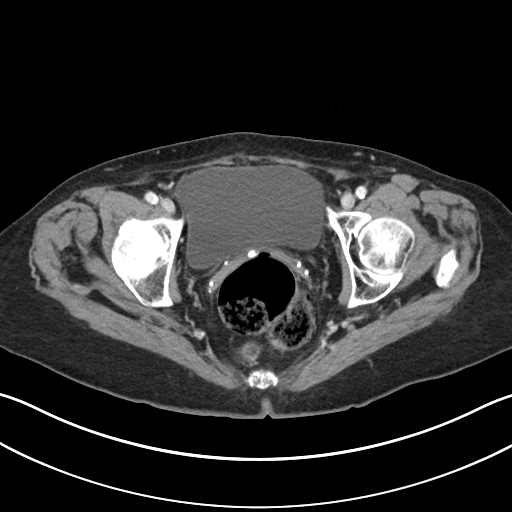
[im 33/87  soft-tissue]
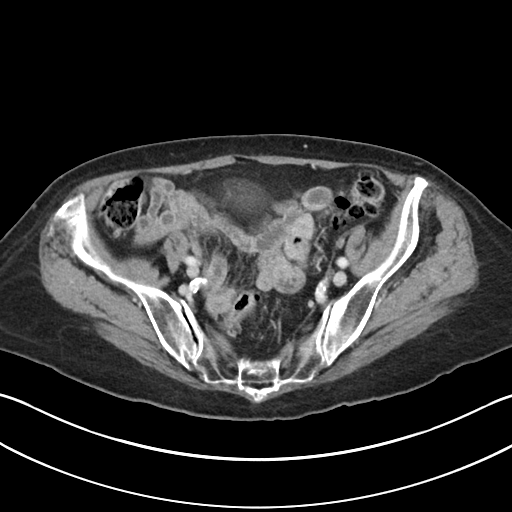
[im 44/87  soft-tissue]
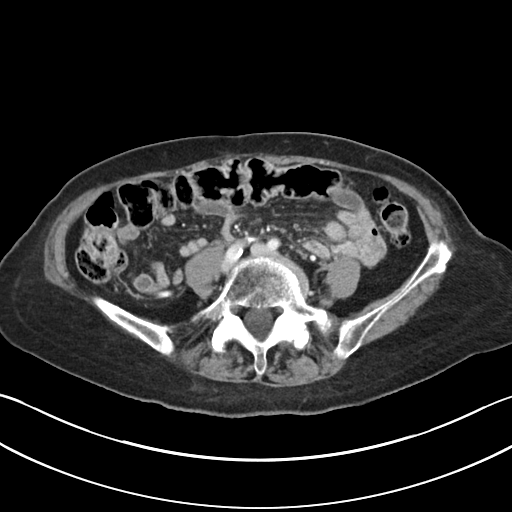
[im 44/87  lung]
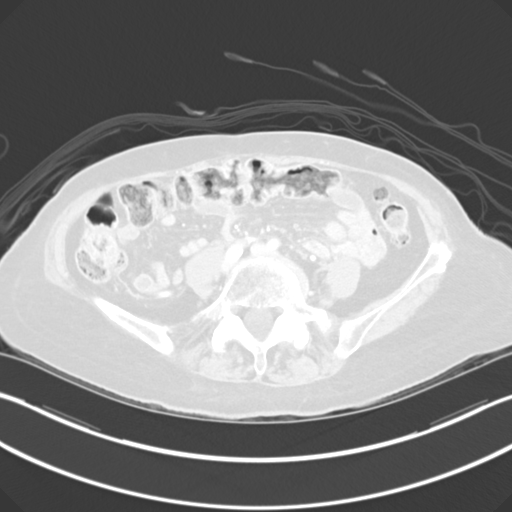
[im 54/87  soft-tissue]
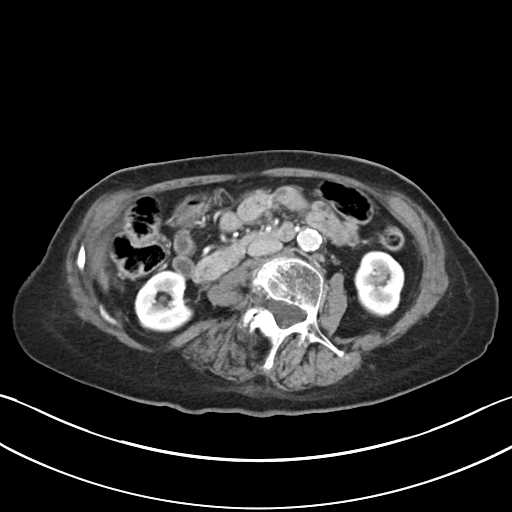
[im 54/87  lung]
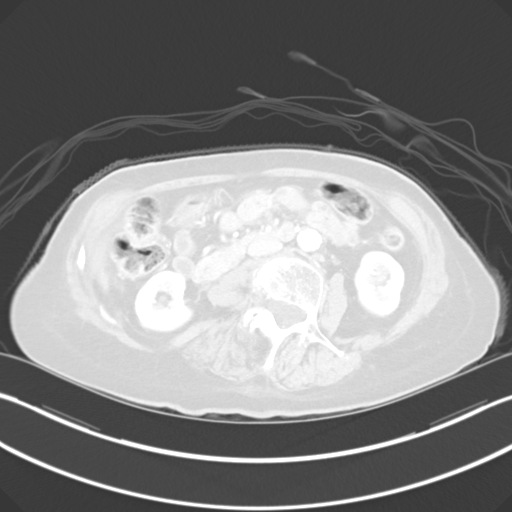
[im 65/87  soft-tissue]
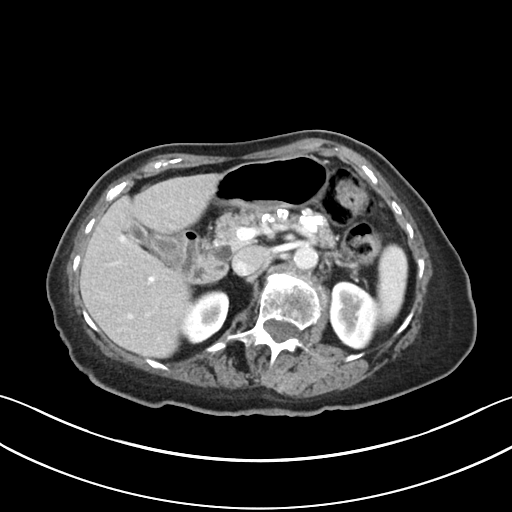
[im 65/87  lung]
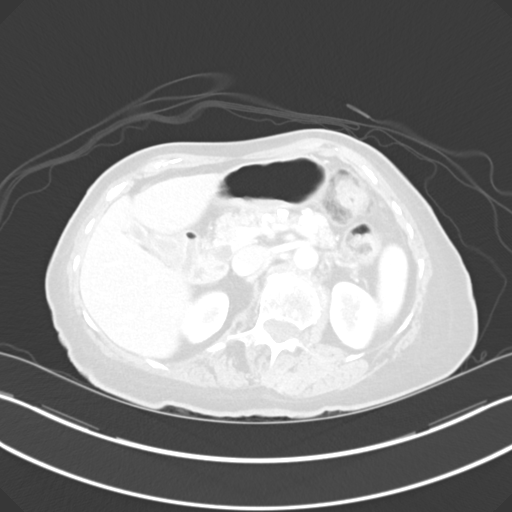
[im 76/87  soft-tissue]
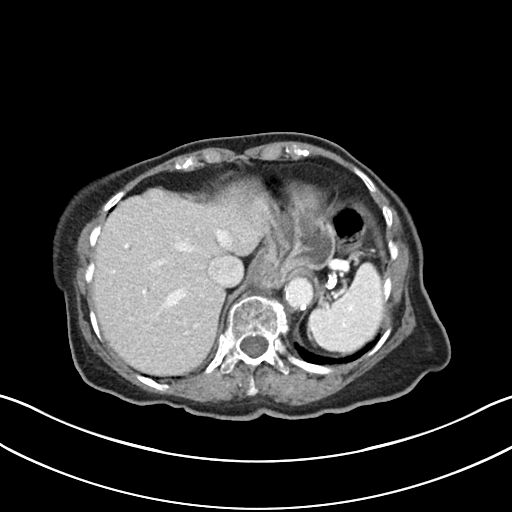
[im 76/87  lung]
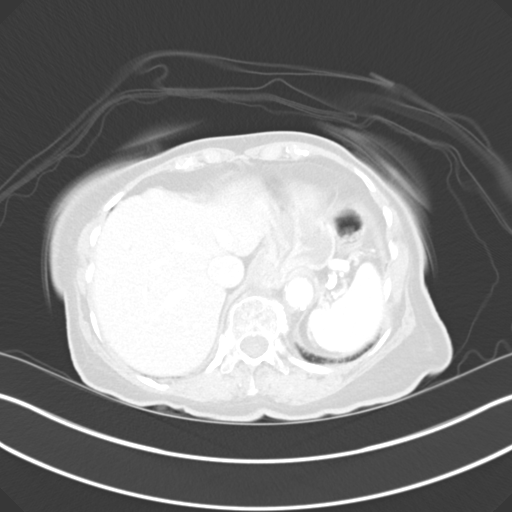

[Series 6: abdomen 3.0 mpr cor · coronal · 0.85mm/px · 3 of 70 slices shown]
[im 24/70  soft-tissue]
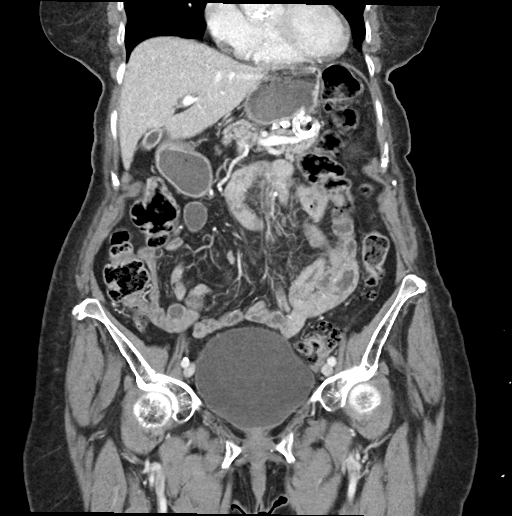
[im 31/70  soft-tissue]
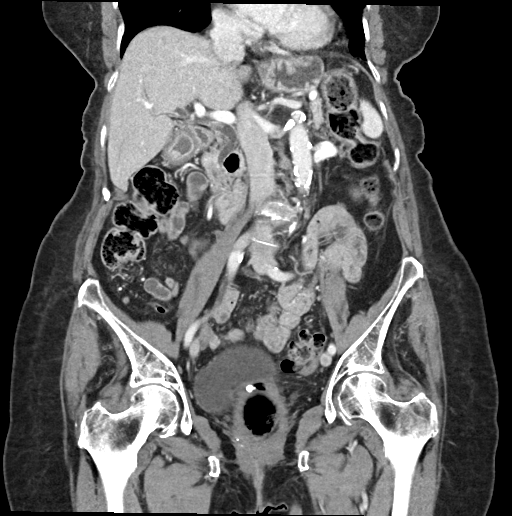
[im 39/70  soft-tissue]
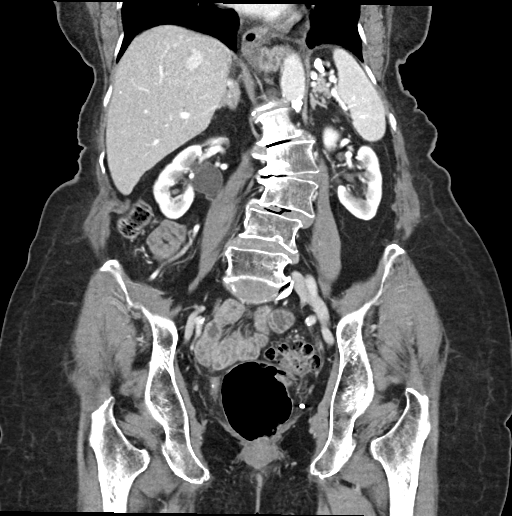

[Series 7: abdomen 3.0 mpr sag · sagittal · 0.41mm/px · 1 of 139 slices shown]
[im 47/139  soft-tissue]
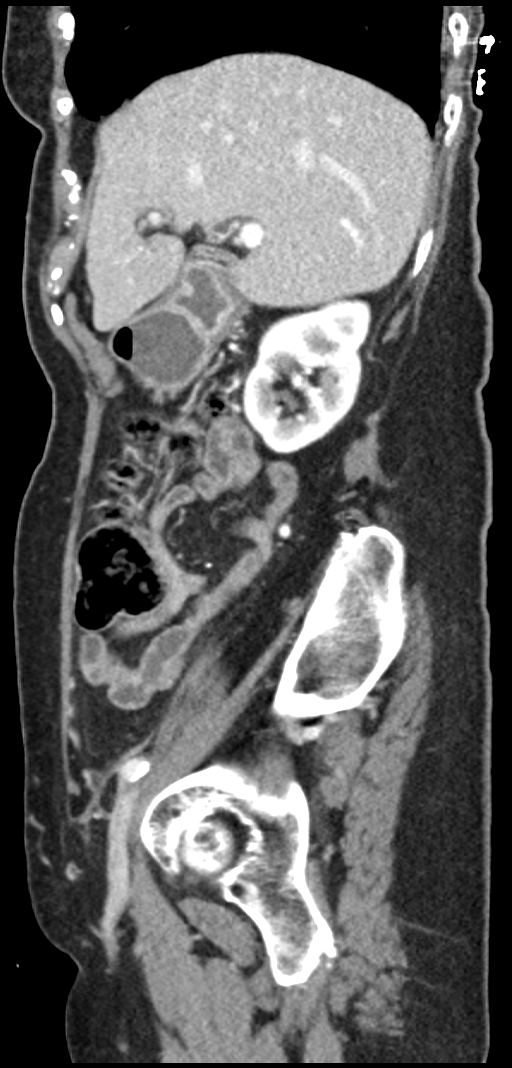

[11 of 46 positions shown; findings below may reference images not displayed]

FINDINGS: Lower chest: The visualized lung bases are clear bilaterally.
Extensive calcification of the aortic valve leaflets are noted.
Global cardiac size within normal limits. Small hiatal hernia.

Hepatobiliary: No focal liver abnormality is seen. No gallstones,
gallbladder wall thickening, or biliary dilatation.

Pancreas: Unremarkable

Spleen: Unremarkable

Adrenals/Urinary Tract: Adrenal glands are unremarkable. Kidneys are
normal, without renal calculi, focal lesion, or hydronephrosis.
Bladder is unremarkable.

Stomach/Bowel: There is mild circumferential thickening and
periluminal inflammatory stranding involving the gastric antrum,
best appreciated on axial image # 25 in keeping with infectious or
inflammatory antritis. There is extensive sigmoid diverticulosis
present with mild focal inflammatory changes volume the proximal
sigmoid colon in keeping with very mild acute or subacute,
uncomplicated sigmoid diverticulitis. The stomach, small bowel, and
large bowel are otherwise unremarkable. Appendix normal. No free
intraperitoneal gas or fluid.

Vascular/Lymphatic: There is extensive aortoiliac atherosclerotic
calcification. No aortic aneurysm. Circumaortic left renal vein. No
pathologic adenopathy within the abdomen and pelvis.

Reproductive: Status post hysterectomy. No adnexal masses.

Other: No abdominal wall hernia.

Musculoskeletal: The osseous structures are diffusely osteopenic. No
lytic or blastic bone lesions are seen. Lumbar levoscoliosis noted.
Degenerative changes noted within the lumbar spine. No acute bone
abnormality.
IMPRESSION: Mild circumferential wall thickening and periluminal inflammatory
stranding involving the gastric antrum in keeping with infectious or
inflammatory antritis.

Subtle inflammatory changes involving the proximal sigmoid colon in
keeping with very mild acute, uncomplicated or subacute
diverticulitis. Background severe sigmoid diverticulosis.

Extensive calcification of the aortic valve leaflets.
Echocardiography may be more helpful to assess for valvular
dysfunction.

Peripheral vascular disease

Aortic Atherosclerosis ([id number]).

## 2021-08-01 DIAGNOSIS — M79602 Pain in left arm: Secondary | ICD-10-CM | POA: Diagnosis not present

## 2021-08-04 ENCOUNTER — Other Ambulatory Visit: Payer: Self-pay | Admitting: Physician Assistant

## 2021-08-04 ENCOUNTER — Ambulatory Visit
Admission: RE | Admit: 2021-08-04 | Discharge: 2021-08-04 | Disposition: A | Discharge status: Home / Self Care | Payer: Medicare HMO | Source: Ambulatory Visit | Attending: Physician Assistant | Admitting: Physician Assistant

## 2021-08-04 DIAGNOSIS — M79602 Pain in left arm: Secondary | ICD-10-CM | POA: Diagnosis not present

## 2021-08-04 DIAGNOSIS — L0201 Cutaneous abscess of face: Secondary | ICD-10-CM | POA: Diagnosis not present

## 2021-08-04 DIAGNOSIS — M25512 Pain in left shoulder: Secondary | ICD-10-CM | POA: Diagnosis not present

## 2021-08-21 DIAGNOSIS — C44329 Squamous cell carcinoma of skin of other parts of face: Secondary | ICD-10-CM | POA: Diagnosis not present

## 2021-08-21 DIAGNOSIS — D0439 Carcinoma in situ of skin of other parts of face: Secondary | ICD-10-CM | POA: Diagnosis not present

## 2021-08-21 DIAGNOSIS — L570 Actinic keratosis: Secondary | ICD-10-CM | POA: Diagnosis not present

## 2021-08-21 DIAGNOSIS — R208 Other disturbances of skin sensation: Secondary | ICD-10-CM | POA: Diagnosis not present

## 2021-08-21 DIAGNOSIS — L821 Other seborrheic keratosis: Secondary | ICD-10-CM | POA: Diagnosis not present

## 2021-08-26 DIAGNOSIS — M545 Low back pain, unspecified: Secondary | ICD-10-CM | POA: Diagnosis not present

## 2021-08-26 DIAGNOSIS — G4700 Insomnia, unspecified: Secondary | ICD-10-CM | POA: Diagnosis not present

## 2021-08-26 DIAGNOSIS — L988 Other specified disorders of the skin and subcutaneous tissue: Secondary | ICD-10-CM | POA: Diagnosis not present

## 2021-08-26 DIAGNOSIS — I10 Essential (primary) hypertension: Secondary | ICD-10-CM | POA: Diagnosis not present

## 2021-08-26 DIAGNOSIS — G629 Polyneuropathy, unspecified: Secondary | ICD-10-CM | POA: Diagnosis not present

## 2021-08-26 DIAGNOSIS — K219 Gastro-esophageal reflux disease without esophagitis: Secondary | ICD-10-CM | POA: Diagnosis not present

## 2021-08-26 DIAGNOSIS — G2581 Restless legs syndrome: Secondary | ICD-10-CM | POA: Diagnosis not present

## 2021-08-26 DIAGNOSIS — K2970 Gastritis, unspecified, without bleeding: Secondary | ICD-10-CM | POA: Diagnosis not present

## 2021-08-26 DIAGNOSIS — R413 Other amnesia: Secondary | ICD-10-CM | POA: Diagnosis not present

## 2021-08-26 DIAGNOSIS — E7800 Pure hypercholesterolemia, unspecified: Secondary | ICD-10-CM | POA: Diagnosis not present

## 2021-09-22 DIAGNOSIS — Z961 Presence of intraocular lens: Secondary | ICD-10-CM | POA: Diagnosis not present

## 2021-09-22 DIAGNOSIS — H401132 Primary open-angle glaucoma, bilateral, moderate stage: Secondary | ICD-10-CM | POA: Diagnosis not present

## 2021-11-29 DIAGNOSIS — N390 Urinary tract infection, site not specified: Secondary | ICD-10-CM | POA: Diagnosis not present

## 2022-01-09 DIAGNOSIS — N390 Urinary tract infection, site not specified: Secondary | ICD-10-CM | POA: Diagnosis not present

## 2022-01-26 DIAGNOSIS — Z961 Presence of intraocular lens: Secondary | ICD-10-CM | POA: Diagnosis not present

## 2022-01-26 DIAGNOSIS — H401132 Primary open-angle glaucoma, bilateral, moderate stage: Secondary | ICD-10-CM | POA: Diagnosis not present

## 2022-02-27 DIAGNOSIS — Z23 Encounter for immunization: Secondary | ICD-10-CM | POA: Diagnosis not present

## 2022-02-27 DIAGNOSIS — Z1331 Encounter for screening for depression: Secondary | ICD-10-CM | POA: Diagnosis not present

## 2022-02-27 DIAGNOSIS — K219 Gastro-esophageal reflux disease without esophagitis: Secondary | ICD-10-CM | POA: Diagnosis not present

## 2022-02-27 DIAGNOSIS — G629 Polyneuropathy, unspecified: Secondary | ICD-10-CM | POA: Diagnosis not present

## 2022-02-27 DIAGNOSIS — M545 Low back pain, unspecified: Secondary | ICD-10-CM | POA: Diagnosis not present

## 2022-02-27 DIAGNOSIS — E7800 Pure hypercholesterolemia, unspecified: Secondary | ICD-10-CM | POA: Diagnosis not present

## 2022-02-27 DIAGNOSIS — Z9181 History of falling: Secondary | ICD-10-CM | POA: Diagnosis not present

## 2022-02-27 DIAGNOSIS — K2970 Gastritis, unspecified, without bleeding: Secondary | ICD-10-CM | POA: Diagnosis not present

## 2022-02-27 DIAGNOSIS — G4700 Insomnia, unspecified: Secondary | ICD-10-CM | POA: Diagnosis not present

## 2022-02-27 DIAGNOSIS — Z139 Encounter for screening, unspecified: Secondary | ICD-10-CM | POA: Diagnosis not present

## 2022-02-27 DIAGNOSIS — I10 Essential (primary) hypertension: Secondary | ICD-10-CM | POA: Diagnosis not present

## 2022-02-27 DIAGNOSIS — R413 Other amnesia: Secondary | ICD-10-CM | POA: Diagnosis not present

## 2022-04-13 DIAGNOSIS — H401132 Primary open-angle glaucoma, bilateral, moderate stage: Secondary | ICD-10-CM | POA: Diagnosis not present

## 2022-04-13 DIAGNOSIS — Z961 Presence of intraocular lens: Secondary | ICD-10-CM | POA: Diagnosis not present

## 2022-05-27 DIAGNOSIS — N390 Urinary tract infection, site not specified: Secondary | ICD-10-CM | POA: Diagnosis not present

## 2022-05-27 DIAGNOSIS — I10 Essential (primary) hypertension: Secondary | ICD-10-CM | POA: Diagnosis not present

## 2022-06-02 DIAGNOSIS — R1032 Left lower quadrant pain: Secondary | ICD-10-CM | POA: Diagnosis not present

## 2022-06-11 DIAGNOSIS — K5792 Diverticulitis of intestine, part unspecified, without perforation or abscess without bleeding: Secondary | ICD-10-CM | POA: Diagnosis not present

## 2022-06-11 DIAGNOSIS — M545 Low back pain, unspecified: Secondary | ICD-10-CM | POA: Diagnosis not present

## 2022-06-29 DIAGNOSIS — H401132 Primary open-angle glaucoma, bilateral, moderate stage: Secondary | ICD-10-CM | POA: Diagnosis not present

## 2022-09-02 DIAGNOSIS — M25512 Pain in left shoulder: Secondary | ICD-10-CM | POA: Diagnosis not present

## 2022-09-02 DIAGNOSIS — G4700 Insomnia, unspecified: Secondary | ICD-10-CM | POA: Diagnosis not present

## 2022-09-02 DIAGNOSIS — I10 Essential (primary) hypertension: Secondary | ICD-10-CM | POA: Diagnosis not present

## 2022-09-02 DIAGNOSIS — K219 Gastro-esophageal reflux disease without esophagitis: Secondary | ICD-10-CM | POA: Diagnosis not present

## 2022-09-02 DIAGNOSIS — G2581 Restless legs syndrome: Secondary | ICD-10-CM | POA: Diagnosis not present

## 2022-09-02 DIAGNOSIS — K2970 Gastritis, unspecified, without bleeding: Secondary | ICD-10-CM | POA: Diagnosis not present

## 2022-09-02 DIAGNOSIS — M545 Low back pain, unspecified: Secondary | ICD-10-CM | POA: Diagnosis not present

## 2022-09-02 DIAGNOSIS — G629 Polyneuropathy, unspecified: Secondary | ICD-10-CM | POA: Diagnosis not present

## 2022-09-02 DIAGNOSIS — E7800 Pure hypercholesterolemia, unspecified: Secondary | ICD-10-CM | POA: Diagnosis not present

## 2022-10-08 DIAGNOSIS — Z961 Presence of intraocular lens: Secondary | ICD-10-CM | POA: Diagnosis not present

## 2022-10-08 DIAGNOSIS — H401132 Primary open-angle glaucoma, bilateral, moderate stage: Secondary | ICD-10-CM | POA: Diagnosis not present

## 2022-10-12 DIAGNOSIS — Z8249 Family history of ischemic heart disease and other diseases of the circulatory system: Secondary | ICD-10-CM | POA: Diagnosis not present

## 2022-10-12 DIAGNOSIS — H3530 Unspecified macular degeneration: Secondary | ICD-10-CM | POA: Diagnosis not present

## 2022-10-12 DIAGNOSIS — N182 Chronic kidney disease, stage 2 (mild): Secondary | ICD-10-CM | POA: Diagnosis not present

## 2022-10-12 DIAGNOSIS — E785 Hyperlipidemia, unspecified: Secondary | ICD-10-CM | POA: Diagnosis not present

## 2022-10-12 DIAGNOSIS — I129 Hypertensive chronic kidney disease with stage 1 through stage 4 chronic kidney disease, or unspecified chronic kidney disease: Secondary | ICD-10-CM | POA: Diagnosis not present

## 2022-10-12 DIAGNOSIS — M1990 Unspecified osteoarthritis, unspecified site: Secondary | ICD-10-CM | POA: Diagnosis not present

## 2022-10-12 DIAGNOSIS — G2581 Restless legs syndrome: Secondary | ICD-10-CM | POA: Diagnosis not present

## 2022-10-12 DIAGNOSIS — G4700 Insomnia, unspecified: Secondary | ICD-10-CM | POA: Diagnosis not present

## 2022-10-12 DIAGNOSIS — K219 Gastro-esophageal reflux disease without esophagitis: Secondary | ICD-10-CM | POA: Diagnosis not present

## 2022-10-12 DIAGNOSIS — H409 Unspecified glaucoma: Secondary | ICD-10-CM | POA: Diagnosis not present

## 2022-10-12 DIAGNOSIS — G629 Polyneuropathy, unspecified: Secondary | ICD-10-CM | POA: Diagnosis not present

## 2022-10-12 DIAGNOSIS — Z809 Family history of malignant neoplasm, unspecified: Secondary | ICD-10-CM | POA: Diagnosis not present

## 2022-12-10 DIAGNOSIS — H401132 Primary open-angle glaucoma, bilateral, moderate stage: Secondary | ICD-10-CM | POA: Diagnosis not present

## 2023-01-29 DIAGNOSIS — H401112 Primary open-angle glaucoma, right eye, moderate stage: Secondary | ICD-10-CM | POA: Diagnosis not present

## 2023-01-29 DIAGNOSIS — K219 Gastro-esophageal reflux disease without esophagitis: Secondary | ICD-10-CM | POA: Diagnosis not present

## 2023-01-29 DIAGNOSIS — Z885 Allergy status to narcotic agent status: Secondary | ICD-10-CM | POA: Diagnosis not present

## 2023-01-29 DIAGNOSIS — I10 Essential (primary) hypertension: Secondary | ICD-10-CM | POA: Diagnosis not present

## 2023-01-29 DIAGNOSIS — H401113 Primary open-angle glaucoma, right eye, severe stage: Secondary | ICD-10-CM | POA: Diagnosis not present

## 2023-01-29 DIAGNOSIS — E785 Hyperlipidemia, unspecified: Secondary | ICD-10-CM | POA: Diagnosis not present

## 2023-01-29 DIAGNOSIS — Z79899 Other long term (current) drug therapy: Secondary | ICD-10-CM | POA: Diagnosis not present

## 2023-01-29 DIAGNOSIS — Z882 Allergy status to sulfonamides status: Secondary | ICD-10-CM | POA: Diagnosis not present

## 2023-01-29 DIAGNOSIS — Z888 Allergy status to other drugs, medicaments and biological substances status: Secondary | ICD-10-CM | POA: Diagnosis not present

## 2023-01-29 DIAGNOSIS — Z880 Allergy status to penicillin: Secondary | ICD-10-CM | POA: Diagnosis not present

## 2023-02-20 DIAGNOSIS — N390 Urinary tract infection, site not specified: Secondary | ICD-10-CM | POA: Diagnosis not present

## 2023-03-16 DIAGNOSIS — Z9181 History of falling: Secondary | ICD-10-CM | POA: Diagnosis not present

## 2023-03-16 DIAGNOSIS — G629 Polyneuropathy, unspecified: Secondary | ICD-10-CM | POA: Diagnosis not present

## 2023-03-16 DIAGNOSIS — K219 Gastro-esophageal reflux disease without esophagitis: Secondary | ICD-10-CM | POA: Diagnosis not present

## 2023-03-16 DIAGNOSIS — K2970 Gastritis, unspecified, without bleeding: Secondary | ICD-10-CM | POA: Diagnosis not present

## 2023-03-16 DIAGNOSIS — G2581 Restless legs syndrome: Secondary | ICD-10-CM | POA: Diagnosis not present

## 2023-03-16 DIAGNOSIS — Z139 Encounter for screening, unspecified: Secondary | ICD-10-CM | POA: Diagnosis not present

## 2023-03-16 DIAGNOSIS — R011 Cardiac murmur, unspecified: Secondary | ICD-10-CM | POA: Diagnosis not present

## 2023-03-16 DIAGNOSIS — G4700 Insomnia, unspecified: Secondary | ICD-10-CM | POA: Diagnosis not present

## 2023-03-16 DIAGNOSIS — I10 Essential (primary) hypertension: Secondary | ICD-10-CM | POA: Diagnosis not present

## 2023-03-16 DIAGNOSIS — Z1331 Encounter for screening for depression: Secondary | ICD-10-CM | POA: Diagnosis not present

## 2023-03-16 DIAGNOSIS — M545 Low back pain, unspecified: Secondary | ICD-10-CM | POA: Diagnosis not present

## 2023-03-16 DIAGNOSIS — E7800 Pure hypercholesterolemia, unspecified: Secondary | ICD-10-CM | POA: Diagnosis not present

## 2023-08-16 DIAGNOSIS — Z961 Presence of intraocular lens: Secondary | ICD-10-CM | POA: Diagnosis not present

## 2023-08-16 DIAGNOSIS — H401132 Primary open-angle glaucoma, bilateral, moderate stage: Secondary | ICD-10-CM | POA: Diagnosis not present

## 2023-08-25 DIAGNOSIS — L538 Other specified erythematous conditions: Secondary | ICD-10-CM | POA: Diagnosis not present

## 2023-08-25 DIAGNOSIS — Z85828 Personal history of other malignant neoplasm of skin: Secondary | ICD-10-CM | POA: Diagnosis not present

## 2023-08-25 DIAGNOSIS — D098 Carcinoma in situ of other specified sites: Secondary | ICD-10-CM | POA: Diagnosis not present

## 2023-08-25 DIAGNOSIS — Z08 Encounter for follow-up examination after completed treatment for malignant neoplasm: Secondary | ICD-10-CM | POA: Diagnosis not present

## 2023-08-25 DIAGNOSIS — Z872 Personal history of diseases of the skin and subcutaneous tissue: Secondary | ICD-10-CM | POA: Diagnosis not present

## 2023-08-25 DIAGNOSIS — L821 Other seborrheic keratosis: Secondary | ICD-10-CM | POA: Diagnosis not present

## 2023-08-25 DIAGNOSIS — D0439 Carcinoma in situ of skin of other parts of face: Secondary | ICD-10-CM | POA: Diagnosis not present

## 2023-09-07 DIAGNOSIS — K2970 Gastritis, unspecified, without bleeding: Secondary | ICD-10-CM | POA: Diagnosis not present

## 2023-09-07 DIAGNOSIS — R011 Cardiac murmur, unspecified: Secondary | ICD-10-CM | POA: Diagnosis not present

## 2023-09-07 DIAGNOSIS — L988 Other specified disorders of the skin and subcutaneous tissue: Secondary | ICD-10-CM | POA: Diagnosis not present

## 2023-09-07 DIAGNOSIS — G629 Polyneuropathy, unspecified: Secondary | ICD-10-CM | POA: Diagnosis not present

## 2023-09-07 DIAGNOSIS — M545 Low back pain, unspecified: Secondary | ICD-10-CM | POA: Diagnosis not present

## 2023-09-07 DIAGNOSIS — K219 Gastro-esophageal reflux disease without esophagitis: Secondary | ICD-10-CM | POA: Diagnosis not present

## 2023-09-07 DIAGNOSIS — G4700 Insomnia, unspecified: Secondary | ICD-10-CM | POA: Diagnosis not present

## 2023-09-07 DIAGNOSIS — E7800 Pure hypercholesterolemia, unspecified: Secondary | ICD-10-CM | POA: Diagnosis not present

## 2023-09-07 DIAGNOSIS — Z23 Encounter for immunization: Secondary | ICD-10-CM | POA: Diagnosis not present

## 2023-09-07 DIAGNOSIS — G2581 Restless legs syndrome: Secondary | ICD-10-CM | POA: Diagnosis not present

## 2023-09-07 DIAGNOSIS — I10 Essential (primary) hypertension: Secondary | ICD-10-CM | POA: Diagnosis not present

## 2023-10-25 DIAGNOSIS — H401132 Primary open-angle glaucoma, bilateral, moderate stage: Secondary | ICD-10-CM | POA: Diagnosis not present

## 2024-02-24 DIAGNOSIS — H401132 Primary open-angle glaucoma, bilateral, moderate stage: Secondary | ICD-10-CM | POA: Diagnosis not present

## 2024-02-24 DIAGNOSIS — Z961 Presence of intraocular lens: Secondary | ICD-10-CM | POA: Diagnosis not present
